# Patient Record
Sex: Female | Born: 1955 | Race: Black or African American | Hispanic: No | Marital: Married | Smoking: Former smoker
Health system: Southern US, Community
[De-identification: ages and names within clinical notes are randomized; demographics above are authoritative.]

## PROBLEM LIST (undated history)

## (undated) DIAGNOSIS — E039 Hypothyroidism, unspecified: Secondary | ICD-10-CM

## (undated) DIAGNOSIS — I82409 Acute embolism and thrombosis of unspecified deep veins of unspecified lower extremity: Secondary | ICD-10-CM

## (undated) DIAGNOSIS — E079 Disorder of thyroid, unspecified: Secondary | ICD-10-CM

## (undated) DIAGNOSIS — M1990 Unspecified osteoarthritis, unspecified site: Secondary | ICD-10-CM

## (undated) DIAGNOSIS — K219 Gastro-esophageal reflux disease without esophagitis: Secondary | ICD-10-CM

## (undated) DIAGNOSIS — I10 Essential (primary) hypertension: Secondary | ICD-10-CM

## (undated) DIAGNOSIS — E669 Obesity, unspecified: Secondary | ICD-10-CM

## (undated) HISTORY — DX: Obesity, unspecified: E66.9

## (undated) HISTORY — DX: Essential (primary) hypertension: I10

---

## 1999-04-28 ENCOUNTER — Encounter: Payer: Self-pay | Admitting: Emergency Medicine

## 1999-04-28 ENCOUNTER — Emergency Department (HOSPITAL_COMMUNITY): Admission: EM | Admit: 1999-04-28 | Discharge: 1999-04-28 | Payer: Self-pay | Admitting: Emergency Medicine

## 2000-03-10 ENCOUNTER — Inpatient Hospital Stay (HOSPITAL_COMMUNITY): Admission: RE | Admit: 2000-03-10 | Discharge: 2000-03-11 | Payer: Self-pay | Admitting: Family Medicine

## 2000-03-16 ENCOUNTER — Other Ambulatory Visit: Admission: RE | Admit: 2000-03-16 | Discharge: 2000-03-16 | Payer: Self-pay | Admitting: *Deleted

## 2000-03-30 ENCOUNTER — Other Ambulatory Visit: Admission: RE | Admit: 2000-03-30 | Discharge: 2000-03-30 | Payer: Self-pay | Admitting: *Deleted

## 2000-03-30 ENCOUNTER — Encounter (INDEPENDENT_AMBULATORY_CARE_PROVIDER_SITE_OTHER): Payer: Self-pay | Admitting: Specialist

## 2002-05-04 ENCOUNTER — Other Ambulatory Visit: Admission: RE | Admit: 2002-05-04 | Discharge: 2002-05-04 | Payer: Self-pay | Admitting: Obstetrics and Gynecology

## 2003-09-12 ENCOUNTER — Ambulatory Visit (HOSPITAL_COMMUNITY): Admission: RE | Admit: 2003-09-12 | Discharge: 2003-09-12 | Payer: Self-pay | Admitting: Family Medicine

## 2003-09-15 ENCOUNTER — Other Ambulatory Visit: Admission: RE | Admit: 2003-09-15 | Discharge: 2003-09-15 | Payer: Self-pay | Admitting: Obstetrics and Gynecology

## 2007-04-19 ENCOUNTER — Encounter: Admission: RE | Admit: 2007-04-19 | Discharge: 2007-04-19 | Payer: Self-pay | Admitting: Family Medicine

## 2011-01-24 NOTE — H&P (Signed)
[place]. [facility]  Patient:    Jocelyn White, Jocelyn White                  MRN: [id number] Adm. Date:  [id number] Disc. Date: [id number] Attending:  Pamelia Hoit CC:         Genene Churn. Cyndie Chime, M.D.             Andres Ege, M.D.                         History and Physical  IDENTIFICATION:  A 55 year old married black female from [place], [place].  CHIEF COMPLAINT:  Pain in right leg began 4 days ago.  HISTORY OF PRESENT ILLNESS:  The patient woke up Saturday morning with pain in her right leg.  There had been no recent change in activities or trauma.  No change in her medications.  She has a positive family history of blood clots in the legs of her mom, and aunt, and uncle.  She was seen in the office the day before admission at which time she was slightly tender in the right lower leg.  A venous Doppler exam done on the day of admission showed a superficial and deep vein thrombosis on the right leg extending to but not into the popliteal vein.  She was admitted for evaluation and treatment for early DVT, right leg.  PAST MEDICAL HISTORY:  She has no known drug allergies.  Has never had surgery.  Has been on a birth control for 27 years; this helps regulate otherwise heavy periods.  CURRENT MEDICATIONS:  Desogen prescribed by Dr. Andres Ege. She took Aleve over-the-counter. No other medicines.  HABITS:  She does not smoke cigarettes (quit smoking in 1986).  Does not drink alcohol.  SOCIAL AND FAMILY HISTORY:  She is the third of four siblings; all are well.  None have had blood clots.  She works at [employer]; is on her feet most of the day.  She has one daughter and one granddaughter--both are well. Mother has had blood clots in her legs and is on long-term Coumadin therapy. Aunt and uncle as above.  PHYSICAL EXAMINATION:  GENERAL:  A large black female in no distress.  Mental status is normal.  VITAL SIGNS:  She is  afebrile.  Pulse 68 and regular, respirations 20, blood pressure 147/80.  SKIN:  Without lesions.  HEENT:  Unremarkable.  HEART:  Regular rate and rhythm without murmurs, rubs, or sounds.  ABDOMEN:  Soft, somewhat overweight.  No masses or tenderness.  GU/BREAST/PELVIC:  Deferred.  EXTREMITIES:  Lower extremities have good pulses and no edema of feet and ankles.  She has tenderness in the mid medial right calf but no overlying redness or swelling or deformity.  The knee and ankle joints are normal without effusion.  Negative Homans sign.  Calf and ankle circumferences are equal, 19 inches and 9.5 inches, respectively.  ASSESSMENT:  First time deep venous thrombosis below the knee in the right leg, with a family history of DVT.  Oral contraceptive use for heavy menstrual bleeding.  PLAN:  Start Lovenox per pharmacy recommendation dosing.  Start Coumadin today.  Will discuss use of oral contraceptive medications with her gynecologist.  Will plan on outpatient consultation with hematology/oncology physician after pro times have been stabilized and blood clotting test results have come back.  Discussed case with Dr. Cephas Darby. DD:  03/11/00 TD:  03/11/00 Job: [id number]

## 2011-01-24 NOTE — Discharge Summary (Signed)
[place]. [facility]  Patient:    Jocelyn White, Jocelyn White                  MRN: [id number] Adm. Date:  [id number] Disc. Date: [id number] Attending:  Pamelia Hoit CC:         Cheryle Horsfall, M.D., OB/GYN             Genene Churn. Cyndie Chime, M.D.                           Discharge Summary  FINAL DIAGNOSES: 1. Deep venous thrombosis, right leg below the knee. 2. Oral contraceptive use. 3. Family history of deep venous thrombosis in mother and two maternal aunts. 4. Iron-deficiency anemia.  CONSULTATIONS:  None.  PROCEDURES:  Began anticoagulation therapy with Lovenox and Coumadin.  HOSPITAL COURSE:  The patient is a 55 year old woman from [place], [place], who began having pain in her right leg four days prior to admission. She woke up with this pain.  There had been no recent unusual activities or trauma.  She was seen in the office the day prior to admission and sent on the day of admission for venous Doppler exam which showed thromboses of deep vein and superficial vein to the right lower extremity but none proximal to the knee.  Due top her strong family history of deep venous thrombosis and use of oral contraceptives, she was admitted to start anticoagulation therapy.  This was done with the help of pharmacy to determine the dosing.  She was started on Coumadin and sent home the next day to self administer Lovenox for one week and to have outpatient pro times drawn and dosing adjusted.  Coagulation studies were obtained and pending at the time of discharge.  ADMISSION LABORATORY AND X-RAY DATA:  Doppler exam of right leg showing thrombosis of the lesser saphenous vein, no thrombosis in the popliteal vein, thrombosis in the distal posterior saphenous vein continuing to the upper calf.  CBC on admission: White count 7000, hemoglobin 9.9, MCV 68.5, platelets 113,000.  Pro time on admission and on discharge INR 1.2 both times. Functional protein  S 67 (68-128).  Functional protein C 134 (80-166).  Factor V Leiden mutation negative.  Lupus anticoagulation panel PPD 13.4 (12.0-15.5). Dilute Russell viper venom, PA 48 (33-46).  PTT-D 37 (30-44).  B2GLY - IgM 9, this is considered negative.  B2GLY - IgG 3, this is considered negative. Antiphospholipid lupus anticoagulant TNP (not able to perform LAC portions of the test).  Cardiolipin antibodies IgG 5 (0-16), IgM 11 (0-20), IgA 2 (0-10). Prothrombin gene mutation negative.  DISCHARGE PLAN:  The patient was discharged home after being taught how to self inject Lovenox and given prescription for Coumadin.  DISCHARGE MEDICATIONS: 1. Lovenox 100 mg subcutaneous injection q.12h. 2. Coumadin 5 mg p.o. q.d. 3. Nu-Iron 150 mg plus 500 mg vitamin C once a day. 4. Finish taking birth control pill Desogen through "day #21," then    discontinue this contraception, and on "day #22" start Micronor    1 tablet once a day p.o. continuously. 5. Avoid nonsteroidal anti-inflammatory medicines; use Tylenol as needed    for pain.  FOLLOWUP:  Obtain pro times at [facility] in two days and five days and make office visit in one week.  Follow up with Dr. Cheryle Horsfall in three weeks, and we will make an appointment to see Dr. Cyndie Chime in about a month to review  and interpret coagulation studies. DD:  04/27/00 TD:  04/28/00 Job: [id number]

## 2012-02-06 ENCOUNTER — Other Ambulatory Visit: Payer: Self-pay | Admitting: Family Medicine

## 2012-02-06 DIAGNOSIS — M549 Dorsalgia, unspecified: Secondary | ICD-10-CM

## 2012-02-06 DIAGNOSIS — D696 Thrombocytopenia, unspecified: Secondary | ICD-10-CM

## 2012-02-11 ENCOUNTER — Ambulatory Visit
Admission: RE | Admit: 2012-02-11 | Discharge: 2012-02-11 | Disposition: A | Discharge status: Home / Self Care | Payer: PRIVATE HEALTH INSURANCE | Source: Ambulatory Visit | Attending: Family Medicine | Admitting: Family Medicine

## 2012-02-11 DIAGNOSIS — D696 Thrombocytopenia, unspecified: Secondary | ICD-10-CM

## 2012-02-11 DIAGNOSIS — M549 Dorsalgia, unspecified: Secondary | ICD-10-CM

## 2012-02-11 MED ORDER — IOHEXOL 300 MG/ML  SOLN
125.0000 mL | Freq: Once | INTRAMUSCULAR | Status: AC | PRN
  Administered 2012-02-11: 125 mL via INTRAVENOUS

## 2013-02-22 ENCOUNTER — Encounter: Payer: Self-pay | Admitting: *Deleted

## 2013-02-22 ENCOUNTER — Encounter: Payer: PRIVATE HEALTH INSURANCE | Attending: Family Medicine | Admitting: *Deleted

## 2013-02-22 VITALS — Ht 64.0 in | Wt 254.1 lb

## 2013-02-22 DIAGNOSIS — Z713 Dietary counseling and surveillance: Secondary | ICD-10-CM | POA: Insufficient documentation

## 2013-02-22 DIAGNOSIS — I10 Essential (primary) hypertension: Secondary | ICD-10-CM

## 2013-02-22 DIAGNOSIS — E663 Overweight: Secondary | ICD-10-CM | POA: Insufficient documentation

## 2013-02-22 DIAGNOSIS — E669 Obesity, unspecified: Secondary | ICD-10-CM

## 2013-02-22 NOTE — Progress Notes (Signed)
  Medical Nutrition Therapy:  Appt start time: 1530 end time:  1630.   Assessment:  Primary concerns today: Jocelyn White is hear today since her doctor told her to lose weight. Heaviest weight is now, 150-160 was her lightest weight as an adult 29 years ago. After quitting smoking, getting married gained weight. Suggested talking to doctor about thyroid medication.     MEDICATIONS: see list   DIETARY INTAKE:  Usual eating pattern includes 3 meals during week and 2 meals per day on weekend and  0-2 snacks per day  Everyday foods include fried foods, hershey bar every night  24-hr recall:  B ( AM): boiled egg, 2 strips of bacon, fried bologna, steak biscuit during the week at work, sausage or bacon and fried egg on weekend Snk ( AM): none  L ( PM): salad with lettuce, tomato, cheese, boiled egg, bacon, and ranch dressing, french fries, pizza, hot dogs, fried fish Snk ( PM): none D ( PM): usually cooks at home - potato, fried pork chop, green beans, breakfast for dinner (sausage and eggs), chicken wings and pizza (take out), peas and carrots, beans, cabbage  Snk ( PM): chocolate bar (regular bar), bananas Beverages: orange juice 13.5 ounces, sweet tea regular size lots of ice, water, soda rarely  Usual physical activity: none  Estimated energy needs: 1600 calories  180 g carbohydrates 120 g protein 44 g fat  Progress Towards Goal(s):  In progress.   Nutritional Diagnosis:  -3.3 Overweight/obesity As related to excessive intake from fried foods and limited physical activity. .  As evidenced by dietary recall, doctor's referral, and BMI greater than 30..    Intervention:  Nutrition counseling provided:   Recommended that Jocelyn White make small changes that she could adapt into her current lifestyle. Recommendations included:    Have  just one fried food item per meal  follow My Plate for smaller portion sizes and food groups  smaller or darker pieces of chocolate  smaller glasses  of orange juice  getting physical activity on days off or taking the stairs on morning or afternoon breaks  Drink water throughout the day   Handouts given during visit include:  MyPlate placemat   Monitoring/Evaluation:  Dietary intake, exercise, and body weight in 4-6 week(s).

## 2013-02-22 NOTE — Patient Instructions (Addendum)
   Have  just one fried food item per meal  follow My Plate for smaller portion sizes and food groups  smaller or darker pieces of chocolate  smaller glasses of orange juice  getting physical activity on days off or taking the stairs on morning or afternoon breaks  Drink water throughout the day

## 2013-03-04 ENCOUNTER — Other Ambulatory Visit: Payer: Self-pay | Admitting: Obstetrics and Gynecology

## 2013-03-04 DIAGNOSIS — R29890 Loss of height: Secondary | ICD-10-CM

## 2013-03-25 ENCOUNTER — Ambulatory Visit
Admission: RE | Admit: 2013-03-25 | Discharge: 2013-03-25 | Disposition: A | Discharge status: Home / Self Care | Payer: PRIVATE HEALTH INSURANCE | Source: Ambulatory Visit | Attending: Obstetrics and Gynecology | Admitting: Obstetrics and Gynecology

## 2013-03-25 DIAGNOSIS — R29890 Loss of height: Secondary | ICD-10-CM

## 2013-04-08 ENCOUNTER — Ambulatory Visit: Payer: PRIVATE HEALTH INSURANCE | Admitting: *Deleted

## 2014-04-29 ENCOUNTER — Encounter (HOSPITAL_COMMUNITY): Payer: Self-pay | Admitting: Emergency Medicine

## 2014-04-29 ENCOUNTER — Emergency Department (HOSPITAL_COMMUNITY): Payer: BC Managed Care – PPO

## 2014-04-29 ENCOUNTER — Observation Stay (HOSPITAL_COMMUNITY)
Admission: EM | Admit: 2014-04-29 | Discharge: 2014-04-30 | Disposition: A | Discharge status: Home / Self Care | Payer: BC Managed Care – PPO | Attending: Internal Medicine | Admitting: Internal Medicine

## 2014-04-29 DIAGNOSIS — R0602 Shortness of breath: Secondary | ICD-10-CM | POA: Insufficient documentation

## 2014-04-29 DIAGNOSIS — R110 Nausea: Secondary | ICD-10-CM | POA: Insufficient documentation

## 2014-04-29 DIAGNOSIS — I10 Essential (primary) hypertension: Secondary | ICD-10-CM | POA: Diagnosis present

## 2014-04-29 DIAGNOSIS — Z87891 Personal history of nicotine dependence: Secondary | ICD-10-CM | POA: Diagnosis not present

## 2014-04-29 DIAGNOSIS — Z79899 Other long term (current) drug therapy: Secondary | ICD-10-CM | POA: Insufficient documentation

## 2014-04-29 DIAGNOSIS — E669 Obesity, unspecified: Secondary | ICD-10-CM | POA: Diagnosis not present

## 2014-04-29 DIAGNOSIS — Z791 Long term (current) use of non-steroidal anti-inflammatories (NSAID): Secondary | ICD-10-CM | POA: Insufficient documentation

## 2014-04-29 DIAGNOSIS — R079 Chest pain, unspecified: Principal | ICD-10-CM | POA: Diagnosis present

## 2014-04-29 DIAGNOSIS — E039 Hypothyroidism, unspecified: Secondary | ICD-10-CM | POA: Diagnosis present

## 2014-04-29 LAB — CBC
HEMATOCRIT: 40.4 % (ref 36.0–46.0)
Hemoglobin: 12.4 g/dL (ref 12.0–15.0)
MCH: 22.1 pg — ABNORMAL LOW (ref 26.0–34.0)
MCHC: 30.7 g/dL (ref 30.0–36.0)
MCV: 72.1 fL — AB (ref 78.0–100.0)
Platelets: 106 10*3/uL — ABNORMAL LOW (ref 150–400)
RBC: 5.6 MIL/uL — AB (ref 3.87–5.11)
RDW: 15.8 % — ABNORMAL HIGH (ref 11.5–15.5)
WBC: 7.8 10*3/uL (ref 4.0–10.5)

## 2014-04-29 LAB — I-STAT TROPONIN, ED
Troponin i, poc: 0 ng/mL (ref 0.00–0.08)
Troponin i, poc: 0 ng/mL (ref 0.00–0.08)

## 2014-04-29 LAB — BASIC METABOLIC PANEL
Anion gap: 10 (ref 5–15)
BUN: 14 mg/dL (ref 6–23)
CHLORIDE: 105 meq/L (ref 96–112)
CO2: 28 meq/L (ref 19–32)
CREATININE: 1.11 mg/dL — AB (ref 0.50–1.10)
Calcium: 9.3 mg/dL (ref 8.4–10.5)
GFR calc Af Amer: 62 mL/min — ABNORMAL LOW (ref >90)
GFR calc non Af Amer: 53 mL/min — ABNORMAL LOW (ref >90)
GLUCOSE: 95 mg/dL (ref 70–99)
Potassium: 4.7 mEq/L (ref 3.7–5.3)
Sodium: 143 mEq/L (ref 137–147)

## 2014-04-29 LAB — TROPONIN I: Troponin I: <0.3 ng/mL (ref <0.30)

## 2014-04-29 MED ORDER — ONDANSETRON HCL 4 MG/2ML IJ SOLN: 4.0000 mg | Freq: Four times a day (QID) | INTRAMUSCULAR | Status: DC | PRN

## 2014-04-29 MED ORDER — MORPHINE SULFATE 4 MG/ML IJ SOLN
4.0000 mg | Freq: Once | INTRAMUSCULAR | Status: DC
  Filled 2014-04-29: qty 1

## 2014-04-29 MED ORDER — LISINOPRIL 20 MG PO TABS
20.0000 mg | ORAL_TABLET | Freq: Every day | ORAL | Status: DC
  Administered 2014-04-30: 20 mg via ORAL
  Filled 2014-04-29 (×2): qty 1

## 2014-04-29 MED ORDER — NITROGLYCERIN 0.4 MG SL SUBL
0.4000 mg | SUBLINGUAL_TABLET | SUBLINGUAL | Status: AC | PRN
  Administered 2014-04-29 (×3): 0.4 mg via SUBLINGUAL
  Filled 2014-04-29: qty 1

## 2014-04-29 MED ORDER — ASPIRIN 325 MG PO TABS
325.0000 mg | ORAL_TABLET | Freq: Every day | ORAL | Status: DC
  Administered 2014-04-30: 325 mg via ORAL
  Filled 2014-04-29: qty 1

## 2014-04-29 MED ORDER — ONDANSETRON HCL 4 MG/2ML IJ SOLN
4.0000 mg | Freq: Once | INTRAMUSCULAR | Status: AC
  Administered 2014-04-29: 4 mg via INTRAVENOUS
  Filled 2014-04-29: qty 2

## 2014-04-29 MED ORDER — LEVOTHYROXINE SODIUM 150 MCG PO TABS
150.0000 ug | ORAL_TABLET | Freq: Every day | ORAL | Status: DC
  Administered 2014-04-30: 150 ug via ORAL
  Filled 2014-04-29 (×2): qty 1

## 2014-04-29 MED ORDER — ONDANSETRON HCL 4 MG PO TABS: 4.0000 mg | ORAL_TABLET | Freq: Four times a day (QID) | ORAL | Status: DC | PRN

## 2014-04-29 MED ORDER — PANTOPRAZOLE SODIUM 40 MG PO TBEC
40.0000 mg | DELAYED_RELEASE_TABLET | Freq: Every day | ORAL | Status: DC
  Administered 2014-04-30: 40 mg via ORAL
  Filled 2014-04-29: qty 1

## 2014-04-29 MED ORDER — SODIUM CHLORIDE 0.9 % IJ SOLN
3.0000 mL | Freq: Two times a day (BID) | INTRAMUSCULAR | Status: DC
  Administered 2014-04-29 – 2014-04-30 (×2): 3 mL via INTRAVENOUS

## 2014-04-29 MED ORDER — ACETAMINOPHEN 650 MG RE SUPP: 650.0000 mg | Freq: Four times a day (QID) | RECTAL | Status: DC | PRN

## 2014-04-29 MED ORDER — ACETAMINOPHEN 325 MG PO TABS: 650.0000 mg | ORAL_TABLET | Freq: Four times a day (QID) | ORAL | Status: DC | PRN

## 2014-04-29 MED ORDER — ALUM & MAG HYDROXIDE-SIMETH 200-200-20 MG/5ML PO SUSP: 30.0000 mL | Freq: Four times a day (QID) | ORAL | Status: DC | PRN

## 2014-04-29 MED ORDER — NITROGLYCERIN 2 % TD OINT
0.5000 [in_us] | TOPICAL_OINTMENT | Freq: Four times a day (QID) | TRANSDERMAL | Status: DC
  Administered 2014-04-29 – 2014-04-30 (×3): 0.5 [in_us] via TOPICAL
  Filled 2014-04-29: qty 30

## 2014-04-29 MED ORDER — ENOXAPARIN SODIUM 40 MG/0.4ML ~~LOC~~ SOLN
40.0000 mg | SUBCUTANEOUS | Status: DC
  Administered 2014-04-29: 40 mg via SUBCUTANEOUS
  Filled 2014-04-29 (×2): qty 0.4

## 2014-04-29 MED ORDER — SODIUM CHLORIDE 0.9 % IV SOLN: 250.0000 mL | INTRAVENOUS | Status: DC | PRN

## 2014-04-29 MED ORDER — OXYCODONE HCL 5 MG PO TABS: 5.0000 mg | ORAL_TABLET | ORAL | Status: DC | PRN

## 2014-04-29 MED ORDER — ASPIRIN 81 MG PO CHEW
324.0000 mg | CHEWABLE_TABLET | Freq: Once | ORAL | Status: AC
  Administered 2014-04-29: 324 mg via ORAL
  Filled 2014-04-29: qty 4

## 2014-04-29 MED ORDER — SODIUM CHLORIDE 0.9 % IJ SOLN: 3.0000 mL | INTRAMUSCULAR | Status: DC | PRN

## 2014-04-29 MED ORDER — HYDROMORPHONE HCL PF 1 MG/ML IJ SOLN: 0.5000 mg | INTRAMUSCULAR | Status: DC | PRN

## 2014-04-29 NOTE — H&P (Signed)
[facility] Admission History and Physical       Jocelyn White ZHY:[id number] DOB: 06/07/1956 DOA: 04/29/2014  Referring physician: EDP PCP: Delorse LekBURNETT,BRENT A, MD  Specialists:   Chief Complaint: Chest Pain  HPI: Jocelyn White is a 58 y.o. female with a history of HTN, Hypothyroid who presents tot he ED with complaints of Chest Pain that starrted at 1 pm while  And lasted until she had been given 3 SL NTG in the ED.   The pain was located in the mid-chest without radiation rates at 9/10 at the worse associated with SOB and nausea but no vomiting or diaphoresis.  She does also have a positive family histrory of cardiac Disease.   She was evaluated in the ED and had 2 negative troponins, and an EKG without acute changes.   She was referred for continue evaluation.     Review of Systems:  Constitutional: No Weight Loss, No Weight Gain, Night Sweats, Fevers, Chills, Dizziness, Fatigue, or Generalized Weakness HEENT: No Headaches, Difficulty Swallowing,Tooth/Dental Problems,Sore Throat,  No Sneezing, Rhinitis, Ear Ache, Nasal Congestion, or Post Nasal Drip,  Cardio-vascular:  +Chest pain, Orthopnea, PND, Edema in Lower Extremities, Anasarca, Dizziness, Palpitations  Resp: +Dyspnea, No DOE, No Cough, No Hemoptysis, No Wheezing.    GI: No Heartburn, Indigestion, Abdominal Pain, +Nausea, Vomiting, Diarrhea, Hematemesis, Hematochezia, Melena, Change in Bowel Habits,  Loss of Appetite  GU: No Dysuria, Change in Color of Urine, No Urgency or Frequency, No Flank pain.  Musculoskeletal: No Joint Pain or Swelling, No Decreased Range of Motion, No Back Pain.  Neurologic: No Syncope, No Seizures, Muscle Weakness, Paresthesia, Vision Disturbance or Loss, No Diplopia, No Vertigo, No Difficulty Walking,  Skin: No Rash or Lesions. Psych: No Change in Mood or Affect, No Depression or Anxiety, No Memory loss, No Confusion, or Hallucinations   Past Medical History  Diagnosis Date  .  Hypertension   . Obesity     History reviewed. No pertinent past surgical history.    Prior to Admission medications   Medication Sig Start Date End Date Taking? Authorizing Provider  levothyroxine (SYNTHROID, LEVOTHROID) 150 MCG tablet Take 150 mcg by mouth daily before breakfast.   Yes Historical Provider, MD  lisinopril (PRINIVIL,ZESTRIL) 20 MG tablet Take 20 mg by mouth daily.   Yes Historical Provider, MD  meloxicam (MOBIC) 15 MG tablet Take 15 mg by mouth daily.   Yes Historical Provider, MD  omeprazole (PRILOSEC) 40 MG capsule Take 40 mg by mouth daily.   Yes Historical Provider, MD    No Known Allergies   Social History:  reports that she has quit smoking. She does not have any smokeless tobacco history on file. Her alcohol and drug histories are not on file.     Family History  Problem Relation Age of Onset  . Cancer Other   . Heart attack Other   . Stroke Other   . Diabetes Other        Physical Exam:  GEN:  Pleasant Obese 58 y.o.  African American female examined  and in no acute distress; cooperative with exam Filed Vitals:   04/29/14 1842 04/29/14 1845 04/29/14 1930 04/29/14 2000  BP: 119/68 117/64 142/76 156/84  Pulse: 68 62 61 81  Temp:      TempSrc:      Resp: 22 14 14 25   Height:      Weight:      SpO2: 95% 98% 100% 100%   Blood pressure 156/84, pulse 81, temperature 97.8 F (  36.6 C), temperature source Oral, resp. rate 25, height 5\' 3"  (1.6 m), weight 115.667 kg (255 lb), SpO2 100.00%. PSYCH: She is alert and oriented x4; does not appear anxious does not appear depressed; affect is normal HEENT: Normocephalic and Atraumatic, Mucous membranes pink; PERRLA; EOM intact; Fundi:  Benign;  No scleral icterus, Nares: Patent, Oropharynx: Clear, Edentulous with Dentures, Neck:  FROM, No Cervical Lymphadenopathy nor Thyromegaly or Carotid Bruit; No JVD; Breasts:: Not examined CHEST WALL: No tenderness CHEST: Normal respiration, clear to auscultation  bilaterally HEART: Regular rate and rhythm; no murmurs rubs or gallops BACK: No kyphosis or scoliosis; No CVA tenderness ABDOMEN: Positive Bowel Sounds, Obese, Soft Non-Tender; No Masses, No Organomegaly. Rectal Exam: Not done EXTREMITIES: No Cyanosis, Clubbing, or Edema; No Ulcerations. Genitalia: not examined PULSES: 2+ and symmetric SKIN: Normal hydration no rash or ulceration CNS:  Alert and Oriented x 4, No Focal Deficits  Vascular: pulses palpable throughout    Labs on Admission:  Basic Metabolic Panel:  Recent Labs Lab 04/29/14 1531  NA 143  K 4.7  CL 105  CO2 28  GLUCOSE 95  BUN 14  CREATININE 1.11*  CALCIUM 9.3   Liver Function Tests: No results found for this basename: AST, ALT, ALKPHOS, BILITOT, PROT, ALBUMIN,  in the last 168 hours No results found for this basename: LIPASE, AMYLASE,  in the last 168 hours No results found for this basename: AMMONIA,  in the last 168 hours CBC:  Recent Labs Lab 04/29/14 1531  WBC 7.8  HGB 12.4  HCT 40.4  MCV 72.1*  PLT 106*   Cardiac Enzymes: No results found for this basename: CKTOTAL, CKMB, CKMBINDEX, TROPONINI,  in the last 168 hours  BNP (last 3 results) No results found for this basename: PROBNP,  in the last 8760 hours CBG: No results found for this basename: GLUCAP,  in the last 168 hours  Radiological Exams on Admission: Dg Chest 2 View  04/29/2014   CLINICAL DATA:  Chest pain  EXAM: CHEST  2 VIEW  COMPARISON:  None.  FINDINGS: Lungs are clear. Heart size and pulmonary vascularity are normal. No adenopathy. No pneumothorax. No bone lesions.  IMPRESSION: No edema or consolidation.   Electronically Signed   By: Bretta Bang M.D.   On: 04/29/2014 17:13     EKG: Independently reviewed. Normal Sinus Rhythm at 73 No Acute S-T Changes   Assessment/Plan:   58 y.o. female with  Principal Problem:   1.   Chest pain   Telemetry monitoring   Cycle Troponins, Troponin X 2 Negative already   Nitropaste, ASA,  O2  Active Problems:   2.   Benign essential hypertension   Continue Lisinopril Rx   Monitor BPs    3.   Hypothyroid   Continue Levothyroxine Rx   Check TSH Level     4.   DVT Prophylaxis   Lovenox      Code Status:    FULL CODE   Family Communication:  Family at bedside, Daughters   Disposition Plan:       Observation Telemetry for 24 -48 Hrs  Time spent:  60 Minutes  Ron Parker [facility] Pager [phone]   If 7AM -7PM Please Contact the Day Rounding Team MD for [facility]  If 7PM-7AM, Please Contact night-coverage  [email or web address] Password [facility] 04/29/2014, 8:33 PM

## 2014-04-29 NOTE — ED Notes (Signed)
Pt reports chest pain, onset 1.5 hours ago, while cleaning home, pt denies hx of same, sts that had sob and nausea with episode

## 2014-04-29 NOTE — ED Provider Notes (Signed)
CSN: [id number]     Arrival date & time 04/29/14  1449 History   First MD Initiated Contact with Patient 04/29/14 1638     Chief Complaint  Patient presents with  . Chest Pain     (Consider location/radiation/quality/duration/timing/severity/associated sxs/prior Treatment) HPI Pt is a 58yo female with hx of HTN presenting to ED with c/o centeralized chest pain that radiates to left and right side of lower chest that started about 1:30PM this afternoon while cleaning around her house. Pain is constant, feels like "bricks" sitting on pt's chest, 6/10.  Initial onset was associated with an episode of SOB and nausea but no vomiting or diaphoresis. Denies hx of similar pain. Denies hx of recent illness, cough or congestion. Denies palpitations. Denies SOB at this time. Pain does not radiate into back. Reports family hx of CAD.  Denies hx of diabetes.  Denies leg pain or swelling. Denies recent travel.    PCP: Dr. Doristine Counter, [facility]   Past Medical History  Diagnosis Date  . Hypertension   . Obesity    History reviewed. No pertinent past surgical history. Family History  Problem Relation Age of Onset  . Cancer Other   . Heart attack Other   . Stroke Other   . Diabetes Other    History  Substance Use Topics  . Smoking status: Former Games developer  . Smokeless tobacco: Not on file  . Alcohol Use: Not on file   OB History   Grav Para Term Preterm Abortions TAB SAB Ect Mult Living                 Review of Systems  Constitutional: Negative for fever, chills and diaphoresis.  Respiratory: Positive for shortness of breath. Negative for cough.   Cardiovascular: Positive for chest pain. Negative for palpitations and leg swelling.  Gastrointestinal: Positive for nausea. Negative for vomiting, abdominal pain, diarrhea and constipation.  All other systems reviewed and are negative.     Allergies  Review of patient's allergies indicates no known allergies.  Home  Medications   Prior to Admission medications   Medication Sig Start Date End Date Taking? Authorizing Provider  levothyroxine (SYNTHROID, LEVOTHROID) 150 MCG tablet Take 150 mcg by mouth daily before breakfast.   Yes Historical Provider, MD  lisinopril (PRINIVIL,ZESTRIL) 20 MG tablet Take 20 mg by mouth daily.   Yes Historical Provider, MD  meloxicam (MOBIC) 15 MG tablet Take 15 mg by mouth daily.   Yes Historical Provider, MD  omeprazole (PRILOSEC) 40 MG capsule Take 40 mg by mouth daily.   Yes Historical Provider, MD   BP 117/64  Pulse 62  Temp(Src) 97.8 F (36.6 C) (Oral)  Resp 14  Ht 5\' 3"  (1.6 m)  Wt 255 lb (115.667 kg)  BMI 45.18 kg/m2  SpO2 98% Physical Exam  Nursing note and vitals reviewed. Constitutional: She appears well-developed and well-nourished. No distress.  Pt lying comfortably in exam bed, NAD.   HENT:  Head: Normocephalic and atraumatic.  Eyes: Conjunctivae are normal. No scleral icterus.  Neck: Normal range of motion. Neck supple.  Cardiovascular: Normal rate, regular rhythm and normal heart sounds.   Pulmonary/Chest: Effort normal and breath sounds normal. No respiratory distress. She has no wheezes. She has no rales. She exhibits no tenderness.  Abdominal: Soft. Bowel sounds are normal. She exhibits no distension and no mass. There is no tenderness. There is no rebound and no guarding.  Musculoskeletal: Normal range of motion.  Neurological: She is alert.  Skin: Skin is warm and dry. She is not diaphoretic.    ED Course  Procedures (including critical care time) Labs Review Labs Reviewed  CBC - Abnormal; Notable for the following:    RBC 5.60 (*)    MCV 72.1 (*)    MCH 22.1 (*)    RDW 15.8 (*)    Platelets 106 (*)    All other components within normal limits  BASIC METABOLIC PANEL - Abnormal; Notable for the following:    Creatinine, Ser 1.11 (*)    GFR calc non Af Amer 53 (*)    GFR calc Af Amer 62 (*)    All other components within normal  limits  I-STAT TROPOININ, ED  Rosezena SensorI-STAT TROPOININ, ED    Imaging Review Dg Chest 2 View  04/29/2014   CLINICAL DATA:  Chest pain  EXAM: CHEST  2 VIEW  COMPARISON:  None.  FINDINGS: Lungs are clear. Heart size and pulmonary vascularity are normal. No adenopathy. No pneumothorax. No bone lesions.  IMPRESSION: No edema or consolidation.   Electronically Signed   By: Bretta BangWilliam  Woodruff M.D.   On: 04/29/2014 17:13     EKG Interpretation None      MDM   Final diagnoses:  Chest pain, unspecified chest pain type    Pt is a 58yo female c/o chest pain that started around 1:30PM when cleaning her house. Hx concerning for ACS, PE and pneumonia less likely, possibly musculoskeletal.  Pt reports episode of nausea and SOB at onset of CP but denies SOB upon arrival to ED. Denies fever, chills, n/v/d.  Vitals: unremarkable, mild bradycardia at times, O2-95-100% on RA.  Labs: unremarkable.  CXR: unremarkable.  Pt initially given aspirin which did not help CP, pt then given 3 SL nitro, which brought pt's pain from 6/10 to 0/10. EKG: NSR Discussed pt with Dr. Fredderick PhenixBelfi, pt is at moderate risk for major cardiac event based on HEART score. Will consult with hospitalist to admit pt for c/p r/o.    7:44 PM Consulted with Dr. Lovell SheehanJenkins, pt will be admitted for CP r/o to observation unit, tele bed. Pt is stable at this time.    Junius Finnerrin O'Malley, PA-C 04/29/14 1946

## 2014-04-29 NOTE — ED Provider Notes (Signed)
Medical screening examination/treatment/procedure(s) were performed by non-physician practitioner and as supervising physician I was immediately available for consultation/collaboration.   EKG Interpretation   Date/Time:  Saturday April 29 2014 15:04:18 EDT Ventricular Rate:  73 PR Interval:  138 QRS Duration: 84 QT Interval:  382 QTC Calculation: 420 R Axis:   55 Text Interpretation:  Normal sinus rhythm Normal ECG since last tracing no  significant change Confirmed by Linet Brash  MD, Zada Haser ([id number]) on 04/29/2014  8:13:19 PM        Rolan BuccoMelanie Marcello Tuzzolino, MD 04/29/14 2013

## 2014-04-30 ENCOUNTER — Observation Stay (HOSPITAL_COMMUNITY): Payer: BC Managed Care – PPO

## 2014-04-30 DIAGNOSIS — R079 Chest pain, unspecified: Secondary | ICD-10-CM

## 2014-04-30 LAB — BASIC METABOLIC PANEL
Anion gap: 11 (ref 5–15)
BUN: 13 mg/dL (ref 6–23)
CHLORIDE: 107 meq/L (ref 96–112)
CO2: 27 meq/L (ref 19–32)
Calcium: 9 mg/dL (ref 8.4–10.5)
Creatinine, Ser: 1.02 mg/dL (ref 0.50–1.10)
GFR calc Af Amer: 68 mL/min — ABNORMAL LOW (ref >90)
GFR calc non Af Amer: 59 mL/min — ABNORMAL LOW (ref >90)
GLUCOSE: 102 mg/dL — AB (ref 70–99)
POTASSIUM: 3.9 meq/L (ref 3.7–5.3)
Sodium: 145 mEq/L (ref 137–147)

## 2014-04-30 LAB — CBC
HEMATOCRIT: 38.6 % (ref 36.0–46.0)
HEMOGLOBIN: 11.7 g/dL — AB (ref 12.0–15.0)
MCH: 21.7 pg — ABNORMAL LOW (ref 26.0–34.0)
MCHC: 30.3 g/dL (ref 30.0–36.0)
MCV: 71.7 fL — ABNORMAL LOW (ref 78.0–100.0)
Platelets: 123 10*3/uL — ABNORMAL LOW (ref 150–400)
RBC: 5.38 MIL/uL — AB (ref 3.87–5.11)
RDW: 15.8 % — ABNORMAL HIGH (ref 11.5–15.5)
WBC: 7.9 10*3/uL (ref 4.0–10.5)

## 2014-04-30 LAB — TROPONIN I
Troponin I: <0.3 ng/mL (ref <0.30)
Troponin I: <0.3 ng/mL (ref <0.30)

## 2014-04-30 MED ORDER — TECHNETIUM TC 99M SESTAMIBI GENERIC - CARDIOLITE
10.0000 | Freq: Once | INTRAVENOUS | Status: AC | PRN
  Administered 2014-04-30: 10 via INTRAVENOUS

## 2014-04-30 MED ORDER — TECHNETIUM TC 99M SESTAMIBI - CARDIOLITE
30.0000 | Freq: Once | INTRAVENOUS | Status: AC | PRN
  Administered 2014-04-30: 12:00:00 30 via INTRAVENOUS

## 2014-04-30 MED ORDER — ACETAMINOPHEN 325 MG PO TABS: 650.0000 mg | ORAL_TABLET | Freq: Four times a day (QID) | ORAL | Status: DC | PRN

## 2014-04-30 MED ORDER — ASPIRIN EC 81 MG PO TBEC: 81.0000 mg | DELAYED_RELEASE_TABLET | Freq: Every day | ORAL | Status: DC

## 2014-04-30 NOTE — Discharge Instructions (Signed)
Please follow up with primary care doctor in 1-2 weeks as needed

## 2014-04-30 NOTE — Progress Notes (Signed)
Nsg Discharge Note  Admit Date:  04/29/2014 Discharge date: 04/30/2014   Loy Little to be D/C'd Home per MD order.  AVS completed.  Copy for chart, and copy for patient signed, and dated. Patient/caregiver able to verbalize understanding.  Discharge Medication:   Medication List    STOP taking these medications       meloxicam 15 MG tablet  Commonly known as:  MOBIC      TAKE these medications       acetaminophen 325 MG tablet  Commonly known as:  TYLENOL  Take 2 tablets (650 mg total) by mouth every 6 (six) hours as needed for mild pain (or Fever >/= 101).     levothyroxine 150 MCG tablet  Commonly known as:  SYNTHROID, LEVOTHROID  Take 150 mcg by mouth daily before breakfast.     lisinopril 20 MG tablet  Commonly known as:  PRINIVIL,ZESTRIL  Take 20 mg by mouth daily.     omeprazole 40 MG capsule  Commonly known as:  PRILOSEC  Take 40 mg by mouth daily.        Discharge Assessment: Filed Vitals:   04/30/14 1341  BP: 110/63  Pulse: 62  Temp: 98.3 F (36.8 C)  Resp: 17   Skin clean, dry and intact without evidence of skin break down, no evidence of skin tears noted. IV catheter discontinued intact. Site without signs and symptoms of complications - no redness or edema noted at insertion site, patient denies c/o pain - only slight tenderness at site.  Dressing with slight pressure applied.  D/c Instructions-Education: Discharge instructions given to patient/family with verbalized understanding. D/c education completed with patient/family including follow up instructions, medication list, d/c activities limitations if indicated, with other d/c instructions as indicated by MD - patient able to verbalize understanding, all questions fully answered. Patient instructed to return to ED, call 911, or call MD for any changes in condition.  Patient escorted via WC, and D/C home via private auto.  Nilam Quakenbush Consuella Lose, RN 04/30/2014 4:32 PM

## 2014-04-30 NOTE — Progress Notes (Signed)
     The patient was seen in nuclear medicine for a exercise myoview.  She tolerated the procedure well. She walked 6 minutes on Bruce protocol and reached target HR. Very slight TWI in lead III and AVF. No CP or SOB. Will await images.     Thereasa Parkin PA-C  [facility]

## 2014-04-30 NOTE — Progress Notes (Signed)
Utilization Review Completed.   Nilan Iddings, RN, BSN Nurse Case Manager  

## 2014-04-30 NOTE — Discharge Summary (Signed)
Physician Discharge Summary  Jocelyn White ZOX:[id number] DOB: 10-20-1955 DOA: 04/29/2014  PCP: Delorse Lek, MD  Admit date: 04/29/2014 Discharge date: 04/30/2014  Time spent: >35 minutes  Recommendations for Outpatient Follow-up:  follow up with primary care doctor in 1-2 weeks as needed   Discharge Diagnoses:  Principal Problem:   Chest pain Active Problems:   Benign essential hypertension   Hypothyroid   Discharge Condition: stable   Diet recommendation: low sodium  Filed Weights   04/29/14 1508 04/29/14 2035 04/30/14 0556  Weight: 115.667 kg (255 lb) 108.636 kg (239 lb 8 oz) 108.6 kg (239 lb 6.7 oz)    History of present illness:  58 y.o. female with a history of HTN, Hypothyroid who presents tot he ED with complaints of Chest Pain   Hospital Course:  1. Chest pain, atypical; ACS ruled out; ECG, trop unremarkable  -Pt is chest pain free  -stress test: negative; appreciate cardiology assistance  2. HTN, stable cont home regimen  3. Hypothyroidism, cont home regimen  4. GERD, cont PPI   IMPRESSION:  1. No reversible ischemia or infarction.  2. Normal left ventricular wall motion.  3. Left ventricular ejection fraction 63%  4. Low-risk stress test findings*.    Procedures:  Stress test  (i.e. Studies not automatically included, echos, thoracentesis, etc; not x-rays)  Consultations:  Cardiology   Discharge Exam: Filed Vitals:   04/30/14 1320  BP: 118/82  Pulse:   Temp:   Resp:     General: alert Cardiovascular: s1,s2 rrr Respiratory: CTA BL  Discharge Instructions  Discharge Instructions   Diet - low sodium heart healthy    Complete by:  As directed      Discharge instructions    Complete by:  As directed   Please follow up with primary care doctor in 1-2 weeks as needed     Increase activity slowly    Complete by:  As directed             Medication List    STOP taking these medications       meloxicam 15 MG tablet   Commonly known as:  MOBIC      TAKE these medications       acetaminophen 325 MG tablet  Commonly known as:  TYLENOL  Take 2 tablets (650 mg total) by mouth every 6 (six) hours as needed for mild pain (or Fever >/= 101).     levothyroxine 150 MCG tablet  Commonly known as:  SYNTHROID, LEVOTHROID  Take 150 mcg by mouth daily before breakfast.     lisinopril 20 MG tablet  Commonly known as:  PRINIVIL,ZESTRIL  Take 20 mg by mouth daily.     omeprazole 40 MG capsule  Commonly known as:  PRILOSEC  Take 40 mg by mouth daily.       No Known Allergies     Follow-up Information   Follow up with BURNETT,BRENT A, MD In 1 week.   Specialty:  Family Medicine   Contact information:   [address] [place] [address] [phone]        The results of significant diagnostics from this hospitalization (including imaging, microbiology, ancillary and laboratory) are listed below for reference.    Significant Diagnostic Studies: Dg Chest 2 View  04/29/2014   CLINICAL DATA:  Chest pain  EXAM: CHEST  2 VIEW  COMPARISON:  None.  FINDINGS: Lungs are clear. Heart size and pulmonary vascularity are normal. No adenopathy. No pneumothorax.  No bone lesions.  IMPRESSION: No edema or consolidation.   Electronically Signed   By: Bretta Bang M.D.   On: 04/29/2014 17:13   Nm Myocar Multi W/spect W/wall Motion / Ef  04/30/2014   CLINICAL DATA:  Chest pain  EXAM: MYOCARDIAL IMAGING WITH SPECT (REST AND PHARMACOLOGIC-STRESS)  GATED LEFT VENTRICULAR WALL MOTION STUDY  LEFT VENTRICULAR EJECTION FRACTION  TECHNIQUE: Standard myocardial SPECT imaging was performed after resting intravenous injection of 10 mCi Tc-37m sestamibi. Subsequently, intravenous infusion of Lexiscan was performed under the supervision of the Cardiology staff. At peak effect of the drug, 30 mCi Tc-56m sestamibi was injected intravenously and standard myocardial SPECT imaging was performed. Quantitative gated  imaging was also performed to evaluate left ventricular wall motion, and estimate left ventricular ejection fraction.  COMPARISON:  None.  FINDINGS: Perfusion: No decreased activity in the left ventricle on stress imaging to suggest reversible ischemia or infarction. Apical attenuation artifact noted, suspect related to breast attenuation.  Wall Motion: Normal left ventricular wall motion. No left ventricular dilation.  Left Ventricular Ejection Fraction: 63 %  End diastolic volume 95 ml  End systolic volume 35 ml  IMPRESSION: 1. No reversible ischemia or infarction.  2. Normal left ventricular wall motion.  3. Left ventricular ejection fraction 63%  4. Low-risk stress test findings*.  *2012 Appropriate Use Criteria for Coronary Revascularization Focused Update: J Am Coll Cardiol. 2012;59(9):857-881. http://content.dementiazones.com.aspx?articleid=1201161   Electronically Signed   By: Ruel Favors M.D.   On: 04/30/2014 12:33    Microbiology: No results found for this or any previous visit (from the past 240 hour(s)).   Labs: Basic Metabolic Panel:  Recent Labs Lab 04/29/14 1531 04/30/14 0345  NA 143 145  K 4.7 3.9  CL 105 107  CO2 28 27  GLUCOSE 95 102*  BUN 14 13  CREATININE 1.11* 1.02  CALCIUM 9.3 9.0   Liver Function Tests: No results found for this basename: AST, ALT, ALKPHOS, BILITOT, PROT, ALBUMIN,  in the last 168 hours No results found for this basename: LIPASE, AMYLASE,  in the last 168 hours No results found for this basename: AMMONIA,  in the last 168 hours CBC:  Recent Labs Lab 04/29/14 1531 04/30/14 0345  WBC 7.8 7.9  HGB 12.4 11.7*  HCT 40.4 38.6  MCV 72.1* 71.7*  PLT 106* 123*   Cardiac Enzymes:  Recent Labs Lab 04/29/14 2142 04/30/14 0346 04/30/14 0800  TROPONINI <0.30 <0.30 <0.30   BNP: BNP (last 3 results) No results found for this basename: PROBNP,  in the last 8760 hours CBG: No results found for this basename: GLUCAP,  in the last 168  hours     Signed:  Esperanza Sheets  [facility] 04/30/2014, 1:33 PM

## 2014-04-30 NOTE — Consult Note (Signed)
CARDIOLOGY CONSULT NOTE       Patient ID: Jocelyn White MRN: [id number] DOB/AGE: [birth year] 58 y.o.  Admit date: 04/29/2014 Referring Physician:  Lovell Sheehan Primary Physician: Delorse Lek, MD Primary Cardiologist:  New Reason for Consultation:  Chest Pain  Principal Problem:   Chest pain Active Problems:   Benign essential hypertension   Hypothyroid   HPI:   58 yo with no previous history of CAD  Previous benign palpitations.  CRF; HTN on ACE.  Was cleaning house before lunch yesterday and had sharp SSCP Some dyspnea.  Pain then radiated to left shoulder  "I was really scared"  Lasted about 2 hours and improved in ER.  Has not had pain like this before ? Mild nausea no vomiting.  No recent trauma  No history of GERD or PUD  Feels better this am.  Sedentary with mild chronic exertional dyspnea  Compliant with meds.  Pain was not pleuritic or positional   ROS All other systems reviewed and negative except as noted above  Past Medical History  Diagnosis Date  . Hypertension   . Obesity     Family History  Problem Relation Age of Onset  . Cancer Other   . Heart attack Other   . Stroke Other   . Diabetes Other     History   Social History  . Marital Status: Married    Spouse Name: N/A    Number of Children: N/A  . Years of Education: N/A   Occupational History  . Not on file.   Social History Main Topics  . Smoking status: Former Games developer  . Smokeless tobacco: Not on file  . Alcohol Use: Not on file  . Drug Use: Not on file  . Sexual Activity: Not on file   Other Topics Concern  . Not on file   Social History Narrative  . No narrative on file    History reviewed. No pertinent past surgical history.   Marland Kitchen aspirin  325 mg Oral Daily  . enoxaparin (LOVENOX) injection  40 mg Subcutaneous Q24H  . levothyroxine  150 mcg Oral QAC breakfast  . lisinopril  20 mg Oral Daily  . morphine  4 mg Intravenous Once  . nitroGLYCERIN  0.5 inch Topical 4 times per day    . pantoprazole  40 mg Oral Daily  . sodium chloride  3 mL Intravenous Q12H      Physical Exam: Blood pressure 108/59, pulse 70, temperature 97.9 F (36.6 C), temperature source Oral, resp. rate 18, height 5' 3.5" (1.613 m), weight 239 lb 6.7 oz (108.6 kg), SpO2 98.00%.    Affect appropriate Obese black female  HEENT: normal Neck supple with no adenopathy JVP normal no bruits no thyromegaly Lungs clear with no wheezing and good diaphragmatic motion Heart:  S1/S2 no murmur, no rub, gallop or click PMI normal Abdomen: benighn, BS positve, no tenderness, no AAA no bruit.  No HSM or HJR Distal pulses intact with no bruits No edema Neuro non-focal Skin warm and dry No muscular weakness   Labs:   Lab Results  Component Value Date   WBC 7.9 04/30/2014   HGB 11.7* 04/30/2014   HCT 38.6 04/30/2014   MCV 71.7* 04/30/2014   PLT 123* 04/30/2014    Recent Labs Lab 04/30/14 0345  NA 145  K 3.9  CL 107  CO2 27  BUN 13  CREATININE 1.02  CALCIUM 9.0  GLUCOSE 102*   Lab Results  Component Value Date   TROPONINI <0.30  04/30/2014       Radiology: Dg Chest 2 View  04/29/2014   CLINICAL DATA:  Chest pain  EXAM: CHEST  2 VIEW  COMPARISON:  None.  FINDINGS: Lungs are clear. Heart size and pulmonary vascularity are normal. No adenopathy. No pneumothorax. No bone lesions.  IMPRESSION: No edema or consolidation.   Electronically Signed   By: Bretta Bang M.D.   On: 04/29/2014 17:13    EKG:  SR no acute ST changes low voltage due to body habitus   ASSESSMENT AND PLAN:  Chest Pain:  Somewhat atypical R/O  Have ordered exercise myovue.  Family will try to get tennis shoes for her.  She thinks she can negotiate treadmill HTN:  Continue ACE Thrombocytopenia:  Outpatient f/u Dr Doristine Counter  Denies ETOH  Signed: Charlton Haws 04/30/2014, 8:38 AM

## 2014-04-30 NOTE — Progress Notes (Addendum)
[facility] PROGRESS NOTE  Jocelyn White ZOX:[id number] DOB: March 08, 1956 DOA: 04/29/2014 PCP: Delorse Lek, MD  Assessment/Plan: 58 y.o. female with a history of HTN, Hypothyroid who presents tot he ED with complaints of Chest Pain  1. Chest pain, atypical; ACS ruled out; ECG, trop unremarkable -Pt is chest pain free  -Pt is being scheduled for stress test; appreciate cardiology assistance   2. HTN, stable cont home regimen  3. Hypothyroidism, cont home regimen  4. GERD, cont PPI    Code Status: full Family Communication:  D/w patient, her daughter  (indicate person spoken with, relationship, and if by phone, the number) Disposition Plan: home 24-48 hrs    Consultants:  Cardiology   Procedures:  Pend stress test   Antibiotics:  none (indicate start date, and stop date if known)  HPI/Subjective: alert  Objective: Filed Vitals:   04/30/14 0556  BP: 108/59  Pulse: 70  Temp: 97.9 F (36.6 C)  Resp: 18    Intake/Output Summary (Last 24 hours) at 04/30/14 0854 Last data filed at 04/29/14 2200  Gross per 24 hour  Intake      3 ml  Output    500 ml  Net   -497 ml   Filed Weights   04/29/14 1508 04/29/14 2035 04/30/14 0556  Weight: 115.667 kg (255 lb) 108.636 kg (239 lb 8 oz) 108.6 kg (239 lb 6.7 oz)    Exam:   General:  alert  Cardiovascular: s1,s2 rrr  Respiratory: CTA BL  Abdomen: soft, nt,nd   Musculoskeletal: no LE edema    Data Reviewed: Basic Metabolic Panel:  Recent Labs Lab 04/29/14 1531 04/30/14 0345  NA 143 145  K 4.7 3.9  CL 105 107  CO2 28 27  GLUCOSE 95 102*  BUN 14 13  CREATININE 1.11* 1.02  CALCIUM 9.3 9.0   Liver Function Tests: No results found for this basename: AST, ALT, ALKPHOS, BILITOT, PROT, ALBUMIN,  in the last 168 hours No results found for this basename: LIPASE, AMYLASE,  in the last 168 hours No results found for this basename: AMMONIA,  in the last 168 hours CBC:  Recent Labs Lab  04/29/14 1531 04/30/14 0345  WBC 7.8 7.9  HGB 12.4 11.7*  HCT 40.4 38.6  MCV 72.1* 71.7*  PLT 106* 123*   Cardiac Enzymes:  Recent Labs Lab 04/29/14 2142 04/30/14 0346 04/30/14 0800  TROPONINI <0.30 <0.30 <0.30   BNP (last 3 results) No results found for this basename: PROBNP,  in the last 8760 hours CBG: No results found for this basename: GLUCAP,  in the last 168 hours  No results found for this or any previous visit (from the past 240 hour(s)).   Studies: Dg Chest 2 View  04/29/2014   CLINICAL DATA:  Chest pain  EXAM: CHEST  2 VIEW  COMPARISON:  None.  FINDINGS: Lungs are clear. Heart size and pulmonary vascularity are normal. No adenopathy. No pneumothorax. No bone lesions.  IMPRESSION: No edema or consolidation.   Electronically Signed   By: Bretta Bang M.D.   On: 04/29/2014 17:13    Scheduled Meds: . aspirin  325 mg Oral Daily  . enoxaparin (LOVENOX) injection  40 mg Subcutaneous Q24H  . levothyroxine  150 mcg Oral QAC breakfast  . lisinopril  20 mg Oral Daily  . morphine  4 mg Intravenous Once  . nitroGLYCERIN  0.5 inch Topical 4 times per day  . pantoprazole  40 mg Oral Daily  . sodium chloride  3  mL Intravenous Q12H   Continuous Infusions:   Principal Problem:   Chest pain Active Problems:   Benign essential hypertension   Hypothyroid    Time spent: >35 minutes     Esperanza Sheets  [facility] Pager [phone]. If 7PM-7AM, please contact night-coverage at [email or web address], password [facility] 04/30/2014, 8:54 AM  LOS: 1 day

## 2015-02-05 ENCOUNTER — Emergency Department (HOSPITAL_BASED_OUTPATIENT_CLINIC_OR_DEPARTMENT_OTHER)
Admission: EM | Admit: 2015-02-05 | Discharge: 2015-02-05 | Disposition: A | Discharge status: Home / Self Care | Payer: BLUE CROSS/BLUE SHIELD | Attending: Emergency Medicine | Admitting: Emergency Medicine

## 2015-02-05 ENCOUNTER — Encounter (HOSPITAL_BASED_OUTPATIENT_CLINIC_OR_DEPARTMENT_OTHER): Payer: Self-pay

## 2015-02-05 ENCOUNTER — Emergency Department (HOSPITAL_BASED_OUTPATIENT_CLINIC_OR_DEPARTMENT_OTHER): Payer: BLUE CROSS/BLUE SHIELD

## 2015-02-05 DIAGNOSIS — I10 Essential (primary) hypertension: Secondary | ICD-10-CM | POA: Insufficient documentation

## 2015-02-05 DIAGNOSIS — Z87891 Personal history of nicotine dependence: Secondary | ICD-10-CM | POA: Insufficient documentation

## 2015-02-05 DIAGNOSIS — E669 Obesity, unspecified: Secondary | ICD-10-CM | POA: Insufficient documentation

## 2015-02-05 DIAGNOSIS — M5417 Radiculopathy, lumbosacral region: Secondary | ICD-10-CM

## 2015-02-05 DIAGNOSIS — Z79899 Other long term (current) drug therapy: Secondary | ICD-10-CM | POA: Insufficient documentation

## 2015-02-05 DIAGNOSIS — Z86718 Personal history of other venous thrombosis and embolism: Secondary | ICD-10-CM | POA: Insufficient documentation

## 2015-02-05 DIAGNOSIS — M25551 Pain in right hip: Secondary | ICD-10-CM | POA: Diagnosis present

## 2015-02-05 DIAGNOSIS — M25559 Pain in unspecified hip: Secondary | ICD-10-CM

## 2015-02-05 HISTORY — DX: Acute embolism and thrombosis of unspecified deep veins of unspecified lower extremity: I82.409

## 2015-02-05 MED ORDER — HYDROCODONE-ACETAMINOPHEN 5-325 MG PO TABS: 1.0000 | ORAL_TABLET | Freq: Four times a day (QID) | ORAL | Status: DC | PRN

## 2015-02-05 MED ORDER — PREDNISONE 50 MG PO TABS: 50.0000 mg | ORAL_TABLET | Freq: Every day | ORAL | Status: DC

## 2015-02-05 MED ORDER — OXYCODONE-ACETAMINOPHEN 5-325 MG PO TABS
1.0000 | ORAL_TABLET | Freq: Once | ORAL | Status: AC
  Administered 2015-02-05: 1 via ORAL
  Filled 2015-02-05: qty 1

## 2015-02-05 NOTE — ED Notes (Signed)
Patient transported to X-ray 

## 2015-02-05 NOTE — ED Notes (Signed)
C/o pain to right hip and upper leg since Friday-denies injury-pt was seated in w/c in ED WR-states she did walk in to ED WR

## 2015-02-05 NOTE — ED Provider Notes (Signed)
CSN: [id number]     Arrival date & time 02/05/15  1157 History   First MD Initiated Contact with Patient 02/05/15 1220     Chief Complaint  Patient presents with  . Hip Pain     (Consider location/radiation/quality/duration/timing/severity/associated sxs/prior Treatment) HPI Jocelyn White is a 59 y.o. female With history of DVT, hypertension, presents to emergency department complaining of right hip and right thigh pain. Patient states pain started 3 days ago. States pain started in the right lower back and in the right buttock. Over last several days pain started radiating down posterior lateral right thigh. States his stops at the knee. She denies any numbness or weakness in extremities. She denies any problems with bowels or with urination. She denies any fever or chills. No injuries. She states she has had similar pain in the left side but was milder than the one she has now. She states she is unable to sleep all night because of pain. She states she had a muscle relaxant left over from when she was treated for left hip pain so she take that with no relief. She also states she took over-the-counter arthritis medication which did not help. She denies swelling in the leg, no pain in the calf or lower leg. Pain is worsened with movement. Nothing makes it better.   Past Medical History  Diagnosis Date  . Hypertension   . Obesity   . DVT (deep venous thrombosis)    History reviewed. No pertinent past surgical history. Family History  Problem Relation Age of Onset  . Cancer Other   . Heart attack Other   . Stroke Other   . Diabetes Other    History  Substance Use Topics  . Smoking status: Former Games developer  . Smokeless tobacco: Not on file  . Alcohol Use: No   OB History    No data available     Review of Systems  Constitutional: Negative for fever and chills.  Respiratory: Negative.   Cardiovascular: Negative.   Gastrointestinal: Negative for abdominal pain.  Genitourinary:  Negative for difficulty urinating.  Musculoskeletal: Positive for back pain and arthralgias. Negative for neck pain.  Neurological: Negative for weakness and numbness.  All other systems reviewed and are negative.     Allergies  Review of patient's allergies indicates no known allergies.  Home Medications   Prior to Admission medications   Medication Sig Start Date End Date Taking? Authorizing Provider  acetaminophen (TYLENOL) 325 MG tablet Take 2 tablets (650 mg total) by mouth every 6 (six) hours as needed for mild pain (or Fever >/= 101). 04/30/14   Esperanza Sheets, MD  levothyroxine (SYNTHROID, LEVOTHROID) 150 MCG tablet Take 150 mcg by mouth daily before breakfast.    Historical Provider, MD  lisinopril (PRINIVIL,ZESTRIL) 20 MG tablet Take 20 mg by mouth daily.    Historical Provider, MD  omeprazole (PRILOSEC) 40 MG capsule Take 40 mg by mouth daily.    Historical Provider, MD   BP 132/77 mmHg  Pulse 80  Temp(Src) 98 F (36.7 C) (Oral)  Resp 18  Ht  (1.626 m)  Wt 250 lb (113.399 kg)  BMI 42.89 kg/m2  SpO2 99% Physical Exam  Constitutional: She is oriented to person, place, and time. She appears well-developed and well-nourished. No distress.  HENT:  Head: Normocephalic.  Eyes: Conjunctivae are normal.  Neck: Neck supple.  Cardiovascular: Normal rate, regular rhythm and normal heart sounds.   Pulmonary/Chest: Effort normal and breath sounds normal. No respiratory  distress. She has no wheezes. She has no rales.  Musculoskeletal: She exhibits no edema.  Tender to palpation in the right buttock. No tenderness over midline lumbar spine, no tenderness in the right SI joint, none tenderness along the hip joint or the thigh. No pain with range of motion at the hip, no pain with flexion, internal or external rotation. Normal right knee. Dorsal pedal pulses intact. Negative right straight leg raise test.  Neurological: She is alert and oriented to person, place, and time.  5/5  and equal lower extremity strength. 2+ and equal patellar reflexes bilaterally. Pt able to dorsiflex bilateral toes and feet with good strength against resistance. Equal sensation bilaterally over thighs and lower legs.   Skin: Skin is warm and dry.  Psychiatric: She has a normal mood and affect. Her behavior is normal.  Nursing note and vitals reviewed.   ED Course  Procedures (including critical care time) Labs Review Labs Reviewed - No data to display  Imaging Review No results found.   EKG Interpretation None      MDM   Final diagnoses:  Hip pain  Lumbosacral radiculopathy   Pt with right hip and right thigh pain. Pt states she may have a hearniated disk in the past. States similar symptoms on the left, just not as severe. Full ROM of the hip. Able to ambulate. Will get xrays of lumbar spine and right hip. Percocet ordered for pain.    X-rays unremarkable except forgrade 1 anterolisthesis of L4-L5 which is chronic. Some degenerative changes seen. Patient's symptoms most likely are due to lumbar sacral radiculopathy. Plan to discharge home with Norco for pain, prednisone, follow up with primary care doctor. She is able to with no difficulties. She is neurovascularly intact. No evidence of cauda equina  Filed Vitals:   02/05/15 1205  BP: 132/77  Pulse: 80  Temp: 98 F (36.7 C)  TempSrc: Oral  Resp: 18  Height: 5\' 4"  (1.626 m)  Weight: 250 lb (113.399 kg)  SpO2: 99%     Jaynie Crumbleatyana Midori Dado, PA-C 02/05/15 1641  Arby BarretteMarcy Pfeiffer, MD 02/05/15 1715

## 2015-02-05 NOTE — Discharge Instructions (Signed)
norco for pain as prescribed as needed. If going to work, take extra strength tylenol. Take prednisone as prescribed until all gone. Follow up with your doctor. Return if worsening symptoms.   Sciatica Sciatica is pain, weakness, numbness, or tingling along the path of the sciatic nerve. The nerve starts in the lower back and runs down the back of each leg. The nerve controls the muscles in the lower leg and in the back of the knee, while also providing sensation to the back of the thigh, lower leg, and the sole of your foot. Sciatica is a symptom of another medical condition. For instance, nerve damage or certain conditions, such as a herniated disk or bone spur on the spine, pinch or put pressure on the sciatic nerve. This causes the pain, weakness, or other sensations normally associated with sciatica. Generally, sciatica only affects one side of the body. CAUSES   Herniated or slipped disc.  Degenerative disk disease.  A pain disorder involving the narrow muscle in the buttocks (piriformis syndrome).  Pelvic injury or fracture.  Pregnancy.  Tumor (rare). SYMPTOMS  Symptoms can vary from mild to very severe. The symptoms usually travel from the low back to the buttocks and down the back of the leg. Symptoms can include:  Mild tingling or dull aches in the lower back, leg, or hip.  Numbness in the back of the calf or sole of the foot.  Burning sensations in the lower back, leg, or hip.  Sharp pains in the lower back, leg, or hip.  Leg weakness.  Severe back pain inhibiting movement. These symptoms may get worse with coughing, sneezing, laughing, or prolonged sitting or standing. Also, being overweight may worsen symptoms. DIAGNOSIS  Your caregiver will perform a physical exam to look for common symptoms of sciatica. He or she may ask you to do certain movements or activities that would trigger sciatic nerve pain. Other tests may be performed to find the cause of the sciatica. These  may include:  Blood tests.  X-rays.  Imaging tests, such as an MRI or CT scan. TREATMENT  Treatment is directed at the cause of the sciatic pain. Sometimes, treatment is not necessary and the pain and discomfort goes away on its own. If treatment is needed, your caregiver may suggest:  Over-the-counter medicines to relieve pain.  Prescription medicines, such as anti-inflammatory medicine, muscle relaxants, or narcotics.  Applying heat or ice to the painful area.  Steroid injections to lessen pain, irritation, and inflammation around the nerve.  Reducing activity during periods of pain.  Exercising and stretching to strengthen your abdomen and improve flexibility of your spine. Your caregiver may suggest losing weight if the extra weight makes the back pain worse.  Physical therapy.  Surgery to eliminate what is pressing or pinching the nerve, such as a bone spur or part of a herniated disk. HOME CARE INSTRUCTIONS   Only take over-the-counter or prescription medicines for pain or discomfort as directed by your caregiver.  Apply ice to the affected area for 20 minutes, 3-4 times a day for the first 48-72 hours. Then try heat in the same way.  Exercise, stretch, or perform your usual activities if these do not aggravate your pain.  Attend physical therapy sessions as directed by your caregiver.  Keep all follow-up appointments as directed by your caregiver.  Do not wear high heels or shoes that do not provide proper support.  Check your mattress to see if it is too soft. A firm mattress  may lessen your pain and discomfort. SEEK IMMEDIATE MEDICAL CARE IF:   You lose control of your bowel or bladder (incontinence).  You have increasing weakness in the lower back, pelvis, buttocks, or legs.  You have redness or swelling of your back.  You have a burning sensation when you urinate.  You have pain that gets worse when you lie down or awakens you at night.  Your pain is  worse than you have experienced in the past.  Your pain is lasting longer than 4 weeks.  You are suddenly losing weight without reason. MAKE SURE YOU:  Understand these instructions.  Will watch your condition.  Will get help right away if you are not doing well or get worse. Document Released: 08/19/2001 Document Revised: 02/24/2012 Document Reviewed: 01/04/2012 [facility] Patient Information 2015 [employer] [place], [place]. This information is not intended to replace advice given to you by your health care provider. Make sure you discuss any questions you have with your health care provider.

## 2015-02-12 ENCOUNTER — Encounter (HOSPITAL_COMMUNITY): Payer: Self-pay | Admitting: Family Medicine

## 2015-02-12 ENCOUNTER — Emergency Department (HOSPITAL_COMMUNITY)
Admission: EM | Admit: 2015-02-12 | Discharge: 2015-02-12 | Disposition: A | Discharge status: Home / Self Care | Payer: BLUE CROSS/BLUE SHIELD | Attending: Emergency Medicine | Admitting: Emergency Medicine

## 2015-02-12 DIAGNOSIS — M549 Dorsalgia, unspecified: Secondary | ICD-10-CM | POA: Diagnosis present

## 2015-02-12 DIAGNOSIS — Z7952 Long term (current) use of systemic steroids: Secondary | ICD-10-CM | POA: Diagnosis not present

## 2015-02-12 DIAGNOSIS — I10 Essential (primary) hypertension: Secondary | ICD-10-CM | POA: Insufficient documentation

## 2015-02-12 DIAGNOSIS — Z86718 Personal history of other venous thrombosis and embolism: Secondary | ICD-10-CM | POA: Diagnosis not present

## 2015-02-12 DIAGNOSIS — Z79899 Other long term (current) drug therapy: Secondary | ICD-10-CM | POA: Diagnosis not present

## 2015-02-12 DIAGNOSIS — E669 Obesity, unspecified: Secondary | ICD-10-CM | POA: Diagnosis not present

## 2015-02-12 DIAGNOSIS — M5441 Lumbago with sciatica, right side: Secondary | ICD-10-CM | POA: Diagnosis not present

## 2015-02-12 DIAGNOSIS — Z87891 Personal history of nicotine dependence: Secondary | ICD-10-CM | POA: Insufficient documentation

## 2015-02-12 DIAGNOSIS — M5431 Sciatica, right side: Secondary | ICD-10-CM

## 2015-02-12 NOTE — ED Provider Notes (Signed)
CSN: [id number]     Arrival date & time 02/12/15  1042 History  This chart was scribed for non-physician practitioner, Roxy Horseman, PA-C working with Tilden Fossa, MD by Placido Sou, ED scribe. This patient was seen in room TR10C/TR10C and the patient's care was started at 11:28 AM.    Chief Complaint  Patient presents with  . Back Pain    The history is provided by the patient. No language interpreter was used.    HPI Comments: Jocelyn White is a 59 y.o. female, with a history of HTN and DVT, who presents to the Emergency Department complaining of constant, mild, radiating, right sided back pain with onset a week ago. She notes having sciatica and taking taking hydrocodone and prednisone for 5 days with no relief of symptoms. Able to ambulate.  Past Medical History  Diagnosis Date  . Hypertension   . Obesity   . DVT (deep venous thrombosis)    History reviewed. No pertinent past surgical history. Family History  Problem Relation Age of Onset  . Cancer Other   . Heart attack Other   . Stroke Other   . Diabetes Other    History  Substance Use Topics  . Smoking status: Former Games developer  . Smokeless tobacco: Not on file  . Alcohol Use: No   OB History    No data available     Review of Systems  Constitutional: Negative for fever and chills.  Gastrointestinal:       No bowel incontinence  Genitourinary:       No urinary incontinence  Musculoskeletal: Positive for myalgias, back pain and arthralgias.  Neurological:       No saddle anesthesia      Allergies  Review of patient's allergies indicates no known allergies.  Home Medications   Prior to Admission medications   Medication Sig Start Date End Date Taking? Authorizing Provider  acetaminophen (TYLENOL) 325 MG tablet Take 2 tablets (650 mg total) by mouth every 6 (six) hours as needed for mild pain (or Fever >/= 101). 04/30/14   Esperanza Sheets, MD  HYDROcodone-acetaminophen (NORCO) 5-325 MG per  tablet Take 1 tablet by mouth every 6 (six) hours as needed for moderate pain. 02/05/15   Tatyana Kirichenko, PA-C  levothyroxine (SYNTHROID, LEVOTHROID) 150 MCG tablet Take 150 mcg by mouth daily before breakfast.    Historical Provider, MD  lisinopril (PRINIVIL,ZESTRIL) 20 MG tablet Take 20 mg by mouth daily.    Historical Provider, MD  omeprazole (PRILOSEC) 40 MG capsule Take 40 mg by mouth daily.    Historical Provider, MD  predniSONE (DELTASONE) 50 MG tablet Take 1 tablet (50 mg total) by mouth daily. 02/05/15   Tatyana Kirichenko, PA-C   BP 132/75 mmHg  Pulse 65  Temp(Src) 98 F (36.7 C) (Oral)  Resp 20  SpO2 98% Physical Exam  Constitutional: She is oriented to person, place, and time. She appears well-developed and well-nourished. No distress.  HENT:  Head: Normocephalic and atraumatic.  Mouth/Throat: Oropharynx is clear and moist.  Eyes: Conjunctivae and EOM are normal. Pupils are equal, round, and reactive to light. Right eye exhibits no discharge. Left eye exhibits no discharge. No scleral icterus.  Neck: Normal range of motion. Neck supple. No tracheal deviation present.  Cardiovascular: Normal rate, regular rhythm and normal heart sounds.  Exam reveals no gallop and no friction rub.   No murmur heard. Pulmonary/Chest: Effort normal and breath sounds normal. No respiratory distress. She has no wheezes.  Abdominal: Soft.  She exhibits no distension. There is no tenderness.  Musculoskeletal: Normal range of motion.  Lumbar paraspinal muscles tender to palpation, no bony tenderness, step-offs, or gross abnormality or deformity of spine, patient is able to ambulate, moves all extremities  Bilateral great toe extension intact Bilateral plantar/dorsiflexion intact  Neurological: She is alert and oriented to person, place, and time.  Sensation and strength intact bilaterally   Skin: Skin is warm and dry. She is not diaphoretic.  Psychiatric: She has a normal mood and affect. Her  behavior is normal. Judgment and thought content normal.  Nursing note and vitals reviewed.   ED Course  Procedures  DIAGNOSTIC STUDIES: Oxygen Saturation is 98% on RA, normal by my interpretation.    COORDINATION OF CARE: 11:34 AM Discussed treatment plan including recommended physical therapy excercises with pt at bedside and pt agreed to plan.  Labs Review Labs Reviewed - No data to display  Imaging Review No results found.   EKG Interpretation None      MDM   Final diagnoses:  Sciatica, right    Patient with back pain.  No neurological deficits and normal neuro exam.  Patient is ambulatory.  No loss of bowel or bladder control.  Doubt cauda equina.  Denies fever,  doubt epidural abscess or other lesion. Recommend back exercises, stretching, RICE.  She is/has taken norco and prednisone in the past week. Will recommend neurosurg follow-up.  Encouraged the patient that there could be a need for additional workup and/or imaging such as MRI, if the symptoms do not resolve. Patient advised that if the back pain does not resolve, or radiates, this could progress to more serious conditions and is encouraged to follow-up with PCP or orthopedics within 2 weeks.     I personally performed the services described in this documentation, which was scribed in my presence. The recorded information has been reviewed and is accurate.     Roxy Horsemanobert Theodora Lalanne, PA-C 02/12/15 1147  Tilden FossaElizabeth Rees, MD 02/12/15 [phone]

## 2015-02-12 NOTE — Discharge Instructions (Signed)
Back Exercises Back exercises help treat and prevent back injuries. The goal of back exercises is to increase the strength of your abdominal and back muscles and the flexibility of your back. These exercises should be started when you no longer have back pain. Back exercises include:  Pelvic Tilt. Lie on your back with your knees bent. Tilt your pelvis until the lower part of your back is against the floor. Hold this position 5 to 10 sec and repeat 5 to 10 times.  Knee to Chest. Pull first 1 knee up against your chest and hold for 20 to 30 seconds, repeat this with the other knee, and then both knees. This may be done with the other leg straight or bent, whichever feels better.  Sit-Ups or Curl-Ups. Bend your knees 90 degrees. Start with tilting your pelvis, and do a partial, slow sit-up, lifting your trunk only 30 to 45 degrees off the floor. Take at least 2 to 3 seconds for each sit-up. Do not do sit-ups with your knees out straight. If partial sit-ups are difficult, simply do the above but with only tightening your abdominal muscles and holding it as directed.  Hip-Lift. Lie on your back with your knees flexed 90 degrees. Push down with your feet and shoulders as you raise your hips a couple inches off the floor; hold for 10 seconds, repeat 5 to 10 times.  Back arches. Lie on your stomach, propping yourself up on bent elbows. Slowly press on your hands, causing an arch in your low back. Repeat 3 to 5 times. Any initial stiffness and discomfort should lessen with repetition over time.  Shoulder-Lifts. Lie face down with arms beside your body. Keep hips and torso pressed to floor as you slowly lift your head and shoulders off the floor. Do not overdo your exercises, especially in the beginning. Exercises may cause you some mild back discomfort which lasts for a few minutes; however, if the pain is more severe, or lasts for more than 15 minutes, do not continue exercises until you see your caregiver.  Improvement with exercise therapy for back problems is slow.  See your caregivers for assistance with developing a proper back exercise program. Document Released: 10/02/2004 Document Revised: 11/17/2011 Document Reviewed: 06/26/2011 ExitCare Patient Information 2015 [employer]. This information is not intended to replace advice given to you by your health care provider. Make sure you discuss any questions you have with your health care provider.   Sciatica Sciatica is pain, weakness, numbness, or tingling along the path of the sciatic nerve. The nerve starts in the lower back and runs down the back of each leg. The nerve controls the muscles in the lower leg and in the back of the knee, while also providing sensation to the back of the thigh, lower leg, and the sole of your foot. Sciatica is a symptom of another medical condition. For instance, nerve damage or certain conditions, such as a herniated disk or bone spur on the spine, pinch or put pressure on the sciatic nerve. This causes the pain, weakness, or other sensations normally associated with sciatica. Generally, sciatica only affects one side of the body. CAUSES   Herniated or slipped disc.  Degenerative disk disease.  A pain disorder involving the narrow muscle in the buttocks (piriformis syndrome).  Pelvic injury or fracture.  Pregnancy.  Tumor (rare). SYMPTOMS  Symptoms can vary from mild to very severe. The symptoms usually travel from the low back to the buttocks and down the back   of the leg. Symptoms can include:  Mild tingling or dull aches in the lower back, leg, or hip.  Numbness in the back of the calf or sole of the foot.  Burning sensations in the lower back, leg, or hip.  Sharp pains in the lower back, leg, or hip.  Leg weakness.  Severe back pain inhibiting movement. These symptoms may get worse with coughing, sneezing, laughing, or prolonged sitting or standing. Also, being overweight may worsen  symptoms. DIAGNOSIS  Your caregiver will perform a physical exam to look for common symptoms of sciatica. He or she may ask you to do certain movements or activities that would trigger sciatic nerve pain. Other tests may be performed to find the cause of the sciatica. These may include:  Blood tests.  X-rays.  Imaging tests, such as an MRI or CT scan. TREATMENT  Treatment is directed at the cause of the sciatic pain. Sometimes, treatment is not necessary and the pain and discomfort goes away on its own. If treatment is needed, your caregiver may suggest:  Over-the-counter medicines to relieve pain.  Prescription medicines, such as anti-inflammatory medicine, muscle relaxants, or narcotics.  Applying heat or ice to the painful area.  Steroid injections to lessen pain, irritation, and inflammation around the nerve.  Reducing activity during periods of pain.  Exercising and stretching to strengthen your abdomen and improve flexibility of your spine. Your caregiver may suggest losing weight if the extra weight makes the back pain worse.  Physical therapy.  Surgery to eliminate what is pressing or pinching the nerve, such as a bone spur or part of a herniated disk. HOME CARE INSTRUCTIONS   Only take over-the-counter or prescription medicines for pain or discomfort as directed by your caregiver.  Apply ice to the affected area for 20 minutes, 3-4 times a day for the first 48-72 hours. Then try heat in the same way.  Exercise, stretch, or perform your usual activities if these do not aggravate your pain.  Attend physical therapy sessions as directed by your caregiver.  Keep all follow-up appointments as directed by your caregiver.  Do not wear high heels or shoes that do not provide proper support.  Check your mattress to see if it is too soft. A firm mattress may lessen your pain and discomfort. SEEK IMMEDIATE MEDICAL CARE IF:   You lose control of your bowel or bladder  (incontinence).  You have increasing weakness in the lower back, pelvis, buttocks, or legs.  You have redness or swelling of your back.  You have a burning sensation when you urinate.  You have pain that gets worse when you lie down or awakens you at night.  Your pain is worse than you have experienced in the past.  Your pain is lasting longer than 4 weeks.  You are suddenly losing weight without reason. MAKE SURE YOU:  Understand these instructions.  Will watch your condition.  Will get help right away if you are not doing well or get worse. Document Released: 08/19/2001 Document Revised: 02/24/2012 Document Reviewed: 01/04/2012 ExitCare Patient Information 2015 [employer]. This information is not intended to replace advice given to you by your health care provider. Make sure you discuss any questions you have with your health care provider.  

## 2015-02-12 NOTE — ED Notes (Signed)
PT reports the physical therapy starts WED.

## 2015-02-12 NOTE — ED Notes (Signed)
Pt here for right sided back pain. sts seen and dx with sciatica. sts suppose to have therapy soon ans pain meds not working.

## 2015-02-12 NOTE — ED Notes (Signed)
Pt changed her mind and ambulated to front lobby

## 2017-02-01 ENCOUNTER — Emergency Department (HOSPITAL_BASED_OUTPATIENT_CLINIC_OR_DEPARTMENT_OTHER)
Admission: EM | Admit: 2017-02-01 | Discharge: 2017-02-01 | Disposition: A | Discharge status: Home / Self Care | Payer: BLUE CROSS/BLUE SHIELD | Attending: Physician Assistant | Admitting: Physician Assistant

## 2017-02-01 ENCOUNTER — Encounter (HOSPITAL_BASED_OUTPATIENT_CLINIC_OR_DEPARTMENT_OTHER): Payer: Self-pay | Admitting: Emergency Medicine

## 2017-02-01 DIAGNOSIS — Z87891 Personal history of nicotine dependence: Secondary | ICD-10-CM | POA: Insufficient documentation

## 2017-02-01 DIAGNOSIS — I10 Essential (primary) hypertension: Secondary | ICD-10-CM | POA: Insufficient documentation

## 2017-02-01 DIAGNOSIS — Z79899 Other long term (current) drug therapy: Secondary | ICD-10-CM | POA: Diagnosis not present

## 2017-02-01 DIAGNOSIS — E039 Hypothyroidism, unspecified: Secondary | ICD-10-CM | POA: Insufficient documentation

## 2017-02-01 DIAGNOSIS — R42 Dizziness and giddiness: Secondary | ICD-10-CM | POA: Diagnosis not present

## 2017-02-01 HISTORY — DX: Disorder of thyroid, unspecified: E07.9

## 2017-02-01 HISTORY — DX: Gastro-esophageal reflux disease without esophagitis: K21.9

## 2017-02-01 LAB — URINALYSIS, ROUTINE W REFLEX MICROSCOPIC
Bilirubin Urine: NEGATIVE
Glucose, UA: NEGATIVE mg/dL
Hgb urine dipstick: NEGATIVE
Ketones, ur: NEGATIVE mg/dL
LEUKOCYTES UA: NEGATIVE
NITRITE: NEGATIVE
Protein, ur: NEGATIVE mg/dL
Specific Gravity, Urine: 1.008 (ref 1.005–1.030)
pH: 7.5 (ref 5.0–8.0)

## 2017-02-01 LAB — CBC WITH DIFFERENTIAL/PLATELET
BASOS ABS: 0 10*3/uL (ref 0.0–0.1)
Basophils Relative: 0 %
EOS ABS: 0.1 10*3/uL (ref 0.0–0.7)
Eosinophils Relative: 1 %
HCT: 39.4 % (ref 36.0–46.0)
Hemoglobin: 12.3 g/dL (ref 12.0–15.0)
LYMPHS PCT: 35 %
Lymphs Abs: 2.1 10*3/uL (ref 0.7–4.0)
MCH: 22 pg — ABNORMAL LOW (ref 26.0–34.0)
MCHC: 31.2 g/dL (ref 30.0–36.0)
MCV: 70.5 fL — ABNORMAL LOW (ref 78.0–100.0)
Monocytes Absolute: 0.4 10*3/uL (ref 0.1–1.0)
Monocytes Relative: 7 %
NEUTROS ABS: 3.5 10*3/uL (ref 1.7–7.7)
Neutrophils Relative %: 57 %
Platelets: 123 10*3/uL — ABNORMAL LOW (ref 150–400)
RBC: 5.59 MIL/uL — ABNORMAL HIGH (ref 3.87–5.11)
RDW: 17.6 % — AB (ref 11.5–15.5)
Smear Review: ADEQUATE
WBC: 6.1 10*3/uL (ref 4.0–10.5)

## 2017-02-01 LAB — BASIC METABOLIC PANEL
ANION GAP: 7 (ref 5–15)
BUN: 14 mg/dL (ref 6–20)
CALCIUM: 9 mg/dL (ref 8.9–10.3)
CO2: 28 mmol/L (ref 22–32)
Chloride: 106 mmol/L (ref 101–111)
Creatinine, Ser: 0.94 mg/dL (ref 0.44–1.00)
Glucose, Bld: 103 mg/dL — ABNORMAL HIGH (ref 65–99)
Potassium: 3.8 mmol/L (ref 3.5–5.1)
SODIUM: 141 mmol/L (ref 135–145)

## 2017-02-01 MED ORDER — SODIUM CHLORIDE 0.9 % IV BOLUS (SEPSIS): 1000.0000 mL | Freq: Once | INTRAVENOUS | Status: DC

## 2017-02-01 MED ORDER — SODIUM CHLORIDE 0.9 % IV BOLUS (SEPSIS)
1000.0000 mL | Freq: Once | INTRAVENOUS | Status: AC
  Administered 2017-02-01: 1000 mL via INTRAVENOUS

## 2017-02-01 MED ORDER — MECLIZINE HCL 25 MG PO TABS: 25.0000 mg | ORAL_TABLET | Freq: Three times a day (TID) | ORAL | 0 refills | Status: AC | PRN

## 2017-02-01 MED ORDER — MECLIZINE HCL 25 MG PO TABS
25.0000 mg | ORAL_TABLET | Freq: Once | ORAL | Status: AC
  Administered 2017-02-01: 25 mg via ORAL
  Filled 2017-02-01: qty 1

## 2017-02-01 NOTE — ED Notes (Addendum)
Pt able to ambulate to and from BR unassisted, in NAD. Denys dizziness. Refuses wheelchair.

## 2017-02-01 NOTE — Discharge Instructions (Signed)
Read the information below.  Use the prescribed medication as directed.  Please discuss all new medications with your pharmacist.  You may return to the Emergency Department at any time for worsening condition or any new symptoms that concern you.    ° ° °Your exam shows you have had an episode of vertigo, which causes a false sense of movement such as a spinning feeling or walls that seem to move.  Most vertigo is caused by a (usually temporary) problem in the inner ear. Rarely, the back part of the brain can cause vertigo (some mini-strokes / TIA's / strokes), but it appears to be a low risk cause for you at this time. It is important to follow-up with your doctor however, to see if you need further testing.  °Do not drive or participate in potentially dangerous activities requiring balance unless off meds (not drowsy) and the vertigo has resolved. °Most of the time benign vertigo is much better after a few days. However, mild unsteadiness may last for up to 3 months in some patients. An MRI scan or other special tests to evaluate your hearing and balance may be needed if the vertigo does not improve or returns in the future. RETURN IMMEDIATELY IF YOU HAVE ANY OF THE FOLLOWING (call 911): °Increasing vertigo, earache, ear drainage, or loss of hearing.  °Severe headache, blurred or double vision, or trouble walking.  °Fainting or poorly responsive, extreme weakness, chest pain, or palpitations.  °Fever, persistent vomiting, or dehydration.  °Numbness, tingling, incoordination, or weakness of the limbs.  °Change in speech, vision, swallowing, understanding, or other concerns.  °

## 2017-02-01 NOTE — ED Notes (Signed)
AMBULATING PATIENT: Tolerated well, denys any dizziness. HR 72 o2 SAT 98% states she feels much better.

## 2017-02-01 NOTE — ED Provider Notes (Signed)
[facility] Note   CSN: [id number] Arrival date & time: 02/01/17  1043     History   Chief Complaint Chief Complaint  Patient presents with  . Dizziness    HPI Jocelyn White is a 61 y.o. female.  HPI   Pt with hx HTN, obesity, DVT, thyroid disease p/w episode of vertigo that began this morning.  States she woke up at 2am, 3am with slight dizziness.  She went back to sleep and woke up at 8am with spinning.  She is fine at rest but spins with standing up and changing position.  States she the room spins around her and she has to hold on to keep from falling.  Denies any recent sick symptoms, fevers, URI, vomiting, diarrhea, urinary symptoms.  Denies recent medication changes.  She had a headache two days ago that lasted a few hours, frontal, throbbing, relieved with rest and tylenol, and has not recurred.    Past Medical History:  Diagnosis Date  . DVT (deep venous thrombosis) (HCC)   . GERD (gastroesophageal reflux disease)   . Hypertension   . Obesity   . Thyroid disease     Patient Active Problem List   Diagnosis Date Noted  . Chest pain 04/29/2014  . Benign essential hypertension 04/29/2014  . Hypothyroid 04/29/2014    History reviewed. No pertinent surgical history.  OB History    No data available       Home Medications    Prior to Admission medications   Medication Sig Start Date End Date Taking? Authorizing Provider  levothyroxine (SYNTHROID, LEVOTHROID) 175 MCG tablet Take 175 mcg by mouth daily. 01/19/15  Yes [provider]  omeprazole (PRILOSEC) 40 MG capsule Take 40 mg by mouth daily.   Yes [provider]  acetaminophen (TYLENOL) 325 MG tablet Take 2 tablets (650 mg total) by mouth every 6 (six) hours as needed for mild pain (or Fever >/= 101). 04/30/14   Buriev, Isaiah Serge, MD  HYDROcodone-acetaminophen (NORCO) 5-325 MG per tablet Take 1 tablet by mouth every 6 (six) hours as needed for moderate  pain. Patient taking differently: Take 1 tablet by mouth every 4 (four) hours as needed for moderate pain.  02/05/15   Kirichenko, Tatyana, PA-C  lisinopril (PRINIVIL,ZESTRIL) 20 MG tablet Take 20 mg by mouth daily.    [provider]  meclizine (ANTIVERT) 25 MG tablet Take 1 tablet (25 mg total) by mouth 3 (three) times daily as needed for dizziness. 02/01/17   Trixie Dredge, PA-C  predniSONE (DELTASONE) 50 MG tablet Take 1 tablet (50 mg total) by mouth daily. Patient not taking: Reported on 02/12/2015 02/05/15   Jaynie Crumble, PA-C    Family History Family History  Problem Relation Age of Onset  . Cancer Other   . Heart attack Other   . Stroke Other   . Diabetes Other     Social History Social History  Substance Use Topics  . Smoking status: Former Games developer  . Smokeless tobacco: Never Used  . Alcohol use No     Allergies   Patient has no known allergies.   Review of Systems Review of Systems  All other systems reviewed and are negative.    Physical Exam Updated Vital Signs BP 118/72   Pulse (!) 55   Temp 97.7 F (36.5 C) (Oral)   Resp 14   Ht 5\' 4"  (1.626 m)   Wt 114.8 kg (253 lb)   SpO2 98%   BMI 43.43 kg/m  Physical Exam  Constitutional: She appears well-developed and well-nourished. No distress.  HENT:  Head: Normocephalic and atraumatic.  Neck: Neck supple.  Cardiovascular: Normal rate and regular rhythm.   Pulmonary/Chest: Effort normal and breath sounds normal.  Neurological: She is alert.  CN II-XII intact, EOMs intact, no pronator drift, grip strengths equal bilaterally; strength 5/5 in all extremities, sensation intact in all extremities; finger to nose, heel to shin, rapid alternating movements normal.  Gait testing deferred.      Skin: She is not diaphoretic.  Nursing note and vitals reviewed.    ED Treatments / Results  Labs (all labs ordered are listed, but only abnormal results are displayed) Labs Reviewed  BASIC METABOLIC  PANEL - Abnormal; Notable for the following:       Result Value   Glucose, Bld 103 (*)    All other components within normal limits  CBC WITH DIFFERENTIAL/PLATELET - Abnormal; Notable for the following:    RBC 5.59 (*)    MCV 70.5 (*)    MCH 22.0 (*)    RDW 17.6 (*)    Platelets 123 (*)    All other components within normal limits  URINALYSIS, ROUTINE W REFLEX MICROSCOPIC    EKG  EKG Interpretation  Date/Time:  Sunday Feb 01 2017 12:50:31 EDT Ventricular Rate:  64 PR Interval:    QRS Duration: 96 QT Interval:  449 QTC Calculation: 464 R Axis:   47 Text Interpretation:  Sinus rhythm Borderline T abnormalities, anterior leads Normal sinus rhythm Confirmed by Corlis LeakMackuen, Courteney ([id number]) on 02/01/2017 12:53:20 PM Also confirmed by Corlis LeakMackuen, Courteney ([id number]), editor Misty StanleyScales-Price, Shannon [phone]  on 02/01/2017 2:24:39 PM       Radiology No results found.  Procedures Procedures (including critical care time)  Medications Ordered in ED Medications  sodium chloride 0.9 % bolus 1,000 mL (not administered)  sodium chloride 0.9 % bolus 1,000 mL (0 mLs Intravenous Stopped 02/01/17 1518)  meclizine (ANTIVERT) tablet 25 mg (25 mg Oral Given 02/01/17 1230)     Initial Impression / Assessment and Plan / ED Course  I have reviewed the triage vital signs and the nursing notes.  Pertinent labs & imaging results that were available during my care of the patient were reviewed by me and considered in my medical decision making (see chart for details).  Clinical Course as of Feb 01 1637  Wynelle LinkSun Feb 01, 2017  1348 Pt reports great improvement, will ambulate.   [EW]    Clinical Course User Index [EW] ChadWest, Ander Wamser, [place]PA-C    Afebrile, nontoxic patient with symptoms of vertigo that is positional and associated with movement and walking, improved with IVF and meclizine.  No focal neurologic deficits on exam.  Gait normal after treatment.  Workup unremarkable.   D/C home with meclizine, PCP follow  up.  Discussed result, findings, treatment, and follow up  with patient.  Pt given return precautions.  Pt verbalizes understanding and agrees with plan.       Final Clinical Impressions(s) / ED Diagnoses   Final diagnoses:  Vertigo    New Prescriptions Discharge Medication List as of 02/01/2017  2:39 PM    START taking these medications   Details  meclizine (ANTIVERT) 25 MG tablet Take 1 tablet (25 mg total) by mouth 3 (three) times daily as needed for dizziness., Starting Sun 02/01/2017, Print         Trixie DredgeWest, Shariah Assad, PA-C 02/01/17 1639    Abelino DerrickMackuen, Courteney Lyn, MD 02/03/17 1148

## 2017-02-01 NOTE — ED Notes (Signed)
Pt ambulatory to BR without assistance, in NAD. 

## 2017-02-01 NOTE — ED Notes (Signed)
Pt on cardiac monitor and auto VS 

## 2017-02-01 NOTE — ED Triage Notes (Signed)
Nausea and h/a on Friday and at 0100 this am has been feeling like the room is spinning. Hx of vertigo and feels the same, x 5 years ago . Denies nausea or h/a today.

## 2017-02-01 NOTE — ED Notes (Signed)
Report given to Sue RN

## 2017-06-19 DIAGNOSIS — E039 Hypothyroidism, unspecified: Secondary | ICD-10-CM | POA: Diagnosis not present

## 2017-06-19 DIAGNOSIS — Z23 Encounter for immunization: Secondary | ICD-10-CM | POA: Diagnosis not present

## 2017-06-19 DIAGNOSIS — I10 Essential (primary) hypertension: Secondary | ICD-10-CM | POA: Diagnosis not present

## 2017-06-19 DIAGNOSIS — K219 Gastro-esophageal reflux disease without esophagitis: Secondary | ICD-10-CM | POA: Diagnosis not present

## 2017-07-15 DIAGNOSIS — Z1231 Encounter for screening mammogram for malignant neoplasm of breast: Secondary | ICD-10-CM | POA: Diagnosis not present

## 2017-10-23 DIAGNOSIS — M25572 Pain in left ankle and joints of left foot: Secondary | ICD-10-CM | POA: Diagnosis not present

## 2017-10-23 DIAGNOSIS — L309 Dermatitis, unspecified: Secondary | ICD-10-CM | POA: Diagnosis not present

## 2017-10-23 DIAGNOSIS — R42 Dizziness and giddiness: Secondary | ICD-10-CM | POA: Diagnosis not present

## 2017-12-03 ENCOUNTER — Ambulatory Visit (INDEPENDENT_AMBULATORY_CARE_PROVIDER_SITE_OTHER): Payer: BLUE CROSS/BLUE SHIELD | Admitting: Podiatry

## 2017-12-03 ENCOUNTER — Ambulatory Visit (INDEPENDENT_AMBULATORY_CARE_PROVIDER_SITE_OTHER): Payer: BLUE CROSS/BLUE SHIELD

## 2017-12-03 DIAGNOSIS — M779 Enthesopathy, unspecified: Secondary | ICD-10-CM | POA: Diagnosis not present

## 2017-12-03 DIAGNOSIS — S93401A Sprain of unspecified ligament of right ankle, initial encounter: Secondary | ICD-10-CM

## 2017-12-03 MED ORDER — MELOXICAM 7.5 MG PO TABS: 7.5000 mg | ORAL_TABLET | Freq: Every day | ORAL | 0 refills | Status: DC

## 2017-12-03 NOTE — Patient Instructions (Addendum)
Posterior Tibial Tendon Tear Rehab Ask your health care provider which exercises are safe for you. Do exercises exactly as told by your health care provider and adjust them as directed. It is normal to feel mild stretching, pulling, tightness, or discomfort as you do these exercises, but you should stop right away if you feel sudden pain or your pain gets worse.Do not begin these exercises until told by your health care provider. Stretching and range of motion exercises These exercises warm up your muscles and joints and improve the movement and flexibility of your ankle. These exercises also help to relieve pain, numbness, and tingling. Exercise A: Gastroc and soleus stretch  1. Sit on the floor with your left / right leg extended. 2. Loop a belt or towel around ball of your left / right foot. The ball of your foot is on the walking surface, right under your toes. 3. Keep your left / right ankle and foot relaxed and keep your knee straight while you use the belt or towel to pull your foot and ankle toward you. You should feel a gentle stretch behind your calf or knee. 4. Hold this position for __________ seconds. Repeat __________ times. Complete this exercise __________ times a day. Exercise B: Dorsiflexion/plantar flexion  1. Sit with your left / right knee straight or bent. 2. Flex your left / right ankle to tilt the top of your foot toward your shin. 3. Hold this position for __________ seconds. 4. Point your toes downward to tilt the top of your foot away from your shin. 5. Hold this position for __________ seconds. Repeat __________ times with your knee straight and __________ times with your knee bent. Complete this exercise __________ times a day. Exercise C: Ankle plantar flexion, passive  1. Sit with your left / right leg crossed over your opposite knee. 2. Use your opposite hand to pull the top of your foot and toes toward you. You should feel a gentle stretch on the top of your  foot and ankle. 3. Hold this position for __________ seconds. Repeat __________ times. Complete this exercise __________ times a day. Exercise D: Ankle eversion  1. Sit with your left / right ankle crossed over your opposite knee. 2. Grip your left / right foot with your opposite hand, with your thumb on the top of your foot and with your fingers on the bottom of your foot. 3. Gently push your foot downward with a slight rotation so the smallest toes rise slightly toward the ceiling. You should feel a gentle stretch on the inside of your ankle. 4. Hold this stretch for __________ seconds. Repeat __________ times. Complete this exercise __________ times a day. Exercise E: Ankle inversion  1. Sit with your left / right ankle crossed over your opposite knee. 2. Hold your left / right foot with your opposite hand, with your thumb on the bottom of your foot and your fingers on the top of your foot. 3. Gently pull your foot. Your smallest toe should come toward you, and your thumb should be pushing against the ball of your foot. You should feel a gentle stretch on the outside of your ankle. 4. Hold the stretch for __________ seconds. Repeat __________ times. Complete this exercise __________ times a day. Exercise F: Ankle alphabet  1. Sit with your left / right leg supported at the lower leg. ? Do not rest your foot on anything. ? Make sure your foot has room to move freely. 2. Think of your   left / right foot as a paintbrush, and move your foot to trace each letter of the alphabet in the air. Keep your hip and knee still while you trace. 3. Trace every letter from A to Z. Repeat __________ times. Complete this exercise __________ times a day. Strengthening exercises These exercises build strength and endurance in your lower leg. Endurance is the ability to use your muscles for a long time, even after they get tired. Exercise G: Dorsiflexors  1. Secure a rubber exercise band or tube to an  object that will not move if it is pulled on, such as a table leg. 2. Secure the other end of the band around your left / right foot. 3. Sit on the floor, facing the object with your left / right leg extended. The band or tube should be slightly tense when your foot is relaxed. 4. Slowly flex your left / right ankle and toes to bring your foot toward you. 5. Hold this position for __________ seconds. 6. Let the band or tube slowly pull your foot back to the starting position. Repeat __________ times. Complete this exercise __________ times a day. Exercise H: Plantar flexors  1. Sit on the floor with your left / right leg extended. 2. Loop a rubber exercise band or tube around the ball of your __________ foot. The ball of your foot is on the walking surface, right under your toes. The band or tube should be slightly tense when your foot is relaxed. 3. Slowly point your toes downward, pushing them away from you. 4. Hold this position for __________ seconds. 5. Let the band or tube slowly pull your foot back to the starting position. Repeat __________ times. Complete this exercise __________ times a day. Exercise I: Towel curls  1. Sit in a chair on a non-carpeted surface, and put your feet on the floor. 2. Place a towel in front of your feet. If told by your health care provider, add __________ to the end of the towel. 3. Keeping your heel on the floor, put your left / right foot on the towel. 4. Pull the towel toward you by grabbing the towel with your toes and curling them under. Keep your heel on the floor. Repeat __________ times. Complete this exercise __________ times a day. This information is not intended to replace advice given to you by your health care provider. Make sure you discuss any questions you have with your health care provider. Document Released: 08/25/2005 Document Revised: 05/01/2016 Document Reviewed: 08/19/2015 Elsevier Interactive Patient Education  2018 [employer].   Meloxicam tablets What is this medicine? MELOXICAM (mel OX i cam) is a non-steroidal anti-inflammatory drug (NSAID). It is used to reduce swelling and to treat pain. It may be used for osteoarthritis, rheumatoid arthritis, or juvenile rheumatoid arthritis. This medicine may be used for other purposes; ask your health care provider or pharmacist if you have questions. COMMON BRAND NAME(S): Mobic What should I tell my health care provider before I take this medicine? They need to know if you have any of these conditions: -bleeding disorders -cigarette smoker -coronary artery bypass graft (CABG) surgery within the past 2 weeks -drink more than 3 alcohol-containing drinks per day -heart disease -high blood pressure -history of stomach bleeding -kidney disease -liver disease -lung or breathing disease, like asthma -stomach or intestine problems -an unusual or allergic reaction to meloxicam, aspirin, other NSAIDs, other medicines, foods, dyes, or preservatives -pregnant or trying to get pregnant -breast-feeding How should I  use this medicine? Take this medicine by mouth with a full glass of water. Follow the directions on the prescription label. You can take it with or without food. If it upsets your stomach, take it with food. Take your medicine at regular intervals. Do not take it more often than directed. Do not stop taking except on your doctor's advice. A special MedGuide will be given to you by the pharmacist with each prescription and refill. Be sure to read this information carefully each time. Talk to your pediatrician regarding the use of this medicine in children. While this drug may be prescribed for selected conditions, precautions do apply. Patients over 63 years old may have a stronger reaction and need a smaller dose. Overdosage: If you think you have taken too much of this medicine contact a poison control center or emergency room at once. NOTE: This medicine is only  for you. Do not share this medicine with others. What if I miss a dose? If you miss a dose, take it as soon as you can. If it is almost time for your next dose, take only that dose. Do not take double or extra doses. What may interact with this medicine? Do not take this medicine with any of the following medications: -cidofovir -ketorolac This medicine may also interact with the following medications: -aspirin and aspirin-like medicines -certain medicines for blood pressure, heart disease, irregular heart beat -certain medicines for depression, anxiety, or psychotic disturbances -certain medicines that treat or prevent blood clots like warfarin, enoxaparin, dalteparin, apixaban, dabigatran, rivaroxaban -cyclosporine -digoxin -diuretics -methotrexate -other NSAIDs, medicines for pain and inflammation, like ibuprofen and naproxen -pemetrexed This list may not describe all possible interactions. Give your health care provider a list of all the medicines, herbs, non-prescription drugs, or dietary supplements you use. Also tell them if you smoke, drink alcohol, or use illegal drugs. Some items may interact with your medicine. What should I watch for while using this medicine? Tell your doctor or healthcare professional if your symptoms do not start to get better or if they get worse. Do not take other medicines that contain aspirin, ibuprofen, or naproxen with this medicine. Side effects such as stomach upset, nausea, or ulcers may be more likely to occur. Many medicines available without a prescription should not be taken with this medicine. This medicine can cause ulcers and bleeding in the stomach and intestines at any time during treatment. This can happen with no warning and may cause death. There is increased risk with taking this medicine for a long time. Smoking, drinking alcohol, older age, and poor health can also increase risks. Call your doctor right away if you have stomach pain or  blood in your vomit or stool. This medicine does not prevent heart attack or stroke. In fact, this medicine may increase the chance of a heart attack or stroke. The chance may increase with longer use of this medicine and in people who have heart disease. If you take aspirin to prevent heart attack or stroke, talk with your doctor or health care professional. What side effects may I notice from receiving this medicine? Side effects that you should report to your doctor or health care professional as soon as possible: -allergic reactions like skin rash, itching or hives, swelling of the face, lips, or tongue -nausea, vomiting -signs and symptoms of a blood clot such as breathing problems; changes in vision; chest pain; severe, sudden headache; pain, swelling, warmth in the leg; trouble speaking; sudden numbness or weakness  of the face, arm, or leg -signs and symptoms of bleeding such as bloody or black, tarry stools; red or dark-brown urine; spitting up blood or brown material that looks like coffee grounds; red spots on the skin; unusual bruising or bleeding from the eye, gums, or nose -signs and symptoms of liver injury like dark yellow or brown urine; general ill feeling or flu-like symptoms; light-colored stools; loss of appetite; nausea; right upper belly pain; unusually weak or tired; yellowing of the eyes or skin -signs and symptoms of stroke like changes in vision; confusion; trouble speaking or understanding; severe headaches; sudden numbness or weakness of the face, arm, or leg; trouble walking; dizziness; loss of balance or coordination Side effects that usually do not require medical attention (report to your doctor or health care professional if they continue or are bothersome): -constipation -diarrhea -gas This list may not describe all possible side effects. Call your doctor for medical advice about side effects. You may report side effects to FDA at 1-800-FDA-1088. Where should I keep  my medicine? Keep out of the reach of children. Store at room temperature between 15 and 30 degrees C (59 and 86 degrees F). Throw away any unused medicine after the expiration date. NOTE: This sheet is a summary. It may not cover all possible information. If you have questions about this medicine, talk to your doctor, pharmacist, or health care provider.  2018 Elsevier/Gold Standard (2015-09-26 19:28:16)

## 2017-12-04 NOTE — Progress Notes (Signed)
Subjective:   Patient ID: Jocelyn White, female   DOB: 62 y.o.   MRN: [id number]   HPI 62 year old female presents the office today for concerns of pain to the inside aspect of the right ankle which started a month ago.  She states that she has no pain today initially the appointment her pain started to improve.  She states this may have started when she change her shoes but she denies any recent injury or trauma she denies any swelling or redness.  No numbness or tingling.  She has no pain at all today on exam or walking then.  She has no other concerns today.   Review of Systems  All other systems reviewed and are negative.  Past Medical History:  Diagnosis Date  . DVT (deep venous thrombosis) (HCC)   . GERD (gastroesophageal reflux disease)   . Hypertension   . Obesity   . Thyroid disease     No past surgical history on file.   Current Outpatient Medications:  .  acetaminophen (TYLENOL) 325 MG tablet, Take 2 tablets (650 mg total) by mouth every 6 (six) hours as needed for mild pain (or Fever >/= 101)., Disp: , Rfl:  .  HYDROcodone-acetaminophen (NORCO) 5-325 MG per tablet, Take 1 tablet by mouth every 6 (six) hours as needed for moderate pain. (Patient taking differently: Take 1 tablet by mouth every 4 (four) hours as needed for moderate pain. ), Disp: 20 tablet, Rfl: 0 .  levothyroxine (SYNTHROID, LEVOTHROID) 175 MCG tablet, Take 175 mcg by mouth daily., Disp: , Rfl: 2 .  lisinopril (PRINIVIL,ZESTRIL) 20 MG tablet, Take 20 mg by mouth daily., Disp: , Rfl:  .  meclizine (ANTIVERT) 25 MG tablet, Take 1 tablet (25 mg total) by mouth 3 (three) times daily as needed for dizziness., Disp: 20 tablet, Rfl: 0 .  meloxicam (MOBIC) 7.5 MG tablet, Take 1 tablet (7.5 mg total) by mouth daily., Disp: 14 tablet, Rfl: 0 .  omeprazole (PRILOSEC) 40 MG capsule, Take 40 mg by mouth daily., Disp: , Rfl:  .  predniSONE (DELTASONE) 50 MG tablet, Take 1 tablet (50 mg total) by mouth daily. (Patient  not taking: Reported on 02/12/2015), Disp: 5 tablet, Rfl: 0  No Known Allergies  Social History   Socioeconomic History  . Marital status: Married    Spouse name: Not on file  . Number of children: Not on file  . Years of education: Not on file  . Highest education level: Not on file  Occupational History  . Not on file  Social Needs  . Financial resource strain: Not on file  . Food insecurity:    Worry: Not on file    Inability: Not on file  . Transportation needs:    Medical: Not on file    Non-medical: Not on file  Tobacco Use  . Smoking status: Former Games developer  . Smokeless tobacco: Never Used  Substance and Sexual Activity  . Alcohol use: No  . Drug use: No  . Sexual activity: Not on file  Lifestyle  . Physical activity:    Days per week: Not on file    Minutes per session: Not on file  . Stress: Not on file  Relationships  . Social connections:    Talks on phone: Not on file    Gets together: Not on file    Attends religious service: Not on file    Active member of club or organization: Not on file    Attends meetings of  clubs or organizations: Not on file    Relationship status: Not on file  . Intimate partner violence:    Fear of current or ex partner: Not on file    Emotionally abused: Not on file    Physically abused: Not on file    Forced sexual activity: Not on file  Other Topics Concern  . Not on file  Social History Narrative  . Not on file       Objective:  Physical Exam  General: AAO x3, NAD  Dermatological: Skin is warm, dry and supple bilateral. Nails x 10 are well manicured; remaining integument appears unremarkable at this time. There are no open sores, no preulcerative lesions, no rash or signs of infection present.  Vascular: Dorsalis Pedis artery and Posterior Tibial artery pedal pulses are 2/4 bilateral with immedate capillary fill time. Pedal hair growth present. No varicosities and no lower extremity edema present bilateral. There is no  pain with calf compression, swelling, warmth, erythema.   Neruologic: Grossly intact via light touch bilateral. Vibratory intact via tuning fork bilateral. Protective threshold with Semmes Wienstein monofilament intact to all pedal sites bilateral.  Negative Tinel sign. Musculoskeletal: At this time there is no area pinpoint bony tenderness or pain to vibratory sensation.  There is no pain to the Achilles tendon, plantar fascia, flexor, extensor tendons.  Upon palpation of the medial aspect the ankle this is subjectively where she had tenderness and is here to be along the course of the posterior tibial tendon.  There is no tenderness to palpation today and there is chronic bilateral lower extremity which is normal for her but there is been no increased she reports.  No redness or warmth.  Decreased medial arch height.  Muscular strength 5/5 in all groups tested bilateral.  Gait: Unassisted, Nonantalgic.       Assessment:   Likely resolved tendinitis    Plan:  -Treatment options discussed including all alternatives, risks, and complications -Etiology of symptoms were discussed -X-rays were obtained and reviewed with the patient.  No evidence of acute fracture or stress fracture identified today. -We discussed the change in shoes.  We also discussed stretching, rehab exercises.  Ice to the area afterwards. Discussed anti-inflammatories as needed.  I did prescribe meloxicam to take if needed for pain if there is any reoccurrence at her request.  However if she starts to get pain back and is not improving she is to call the office for follow-up.  Vivi BarrackMatthew R Wagoner DPM

## 2017-12-18 DIAGNOSIS — E7800 Pure hypercholesterolemia, unspecified: Secondary | ICD-10-CM | POA: Diagnosis not present

## 2017-12-18 DIAGNOSIS — K219 Gastro-esophageal reflux disease without esophagitis: Secondary | ICD-10-CM | POA: Diagnosis not present

## 2017-12-18 DIAGNOSIS — Z1159 Encounter for screening for other viral diseases: Secondary | ICD-10-CM | POA: Diagnosis not present

## 2017-12-18 DIAGNOSIS — E039 Hypothyroidism, unspecified: Secondary | ICD-10-CM | POA: Diagnosis not present

## 2017-12-18 DIAGNOSIS — I10 Essential (primary) hypertension: Secondary | ICD-10-CM | POA: Diagnosis not present

## 2017-12-28 DIAGNOSIS — J018 Other acute sinusitis: Secondary | ICD-10-CM | POA: Diagnosis not present

## 2018-03-19 DIAGNOSIS — N3001 Acute cystitis with hematuria: Secondary | ICD-10-CM | POA: Diagnosis not present

## 2018-03-19 DIAGNOSIS — R350 Frequency of micturition: Secondary | ICD-10-CM | POA: Diagnosis not present

## 2018-03-19 DIAGNOSIS — R300 Dysuria: Secondary | ICD-10-CM | POA: Diagnosis not present

## 2018-03-28 DIAGNOSIS — J029 Acute pharyngitis, unspecified: Secondary | ICD-10-CM | POA: Diagnosis not present

## 2018-05-11 DIAGNOSIS — R635 Abnormal weight gain: Secondary | ICD-10-CM | POA: Diagnosis not present

## 2018-05-11 DIAGNOSIS — Z1211 Encounter for screening for malignant neoplasm of colon: Secondary | ICD-10-CM | POA: Diagnosis not present

## 2018-05-11 DIAGNOSIS — K219 Gastro-esophageal reflux disease without esophagitis: Secondary | ICD-10-CM | POA: Diagnosis not present

## 2018-05-18 ENCOUNTER — Other Ambulatory Visit: Payer: Self-pay | Admitting: Gastroenterology

## 2018-05-18 DIAGNOSIS — D696 Thrombocytopenia, unspecified: Secondary | ICD-10-CM

## 2018-05-25 ENCOUNTER — Ambulatory Visit (HOSPITAL_COMMUNITY)
Admission: RE | Admit: 2018-05-25 | Discharge: 2018-05-25 | Disposition: A | Discharge status: Home / Self Care | Payer: BLUE CROSS/BLUE SHIELD | Source: Ambulatory Visit | Attending: Gastroenterology | Admitting: Gastroenterology

## 2018-05-25 DIAGNOSIS — D696 Thrombocytopenia, unspecified: Secondary | ICD-10-CM | POA: Insufficient documentation

## 2018-05-25 DIAGNOSIS — K7689 Other specified diseases of liver: Secondary | ICD-10-CM | POA: Insufficient documentation

## 2018-06-02 DIAGNOSIS — Z1211 Encounter for screening for malignant neoplasm of colon: Secondary | ICD-10-CM | POA: Diagnosis not present

## 2018-07-13 DIAGNOSIS — Z1231 Encounter for screening mammogram for malignant neoplasm of breast: Secondary | ICD-10-CM | POA: Diagnosis not present

## 2018-07-16 DIAGNOSIS — K219 Gastro-esophageal reflux disease without esophagitis: Secondary | ICD-10-CM | POA: Diagnosis not present

## 2018-07-16 DIAGNOSIS — I10 Essential (primary) hypertension: Secondary | ICD-10-CM | POA: Diagnosis not present

## 2018-07-16 DIAGNOSIS — Z23 Encounter for immunization: Secondary | ICD-10-CM | POA: Diagnosis not present

## 2018-07-16 DIAGNOSIS — E039 Hypothyroidism, unspecified: Secondary | ICD-10-CM | POA: Diagnosis not present

## 2018-08-03 DIAGNOSIS — H00012 Hordeolum externum right lower eyelid: Secondary | ICD-10-CM | POA: Diagnosis not present

## 2018-08-03 DIAGNOSIS — H04123 Dry eye syndrome of bilateral lacrimal glands: Secondary | ICD-10-CM | POA: Diagnosis not present

## 2018-12-01 DIAGNOSIS — M436 Torticollis: Secondary | ICD-10-CM | POA: Diagnosis not present

## 2019-01-14 DIAGNOSIS — I10 Essential (primary) hypertension: Secondary | ICD-10-CM | POA: Diagnosis not present

## 2019-01-14 DIAGNOSIS — K219 Gastro-esophageal reflux disease without esophagitis: Secondary | ICD-10-CM | POA: Diagnosis not present

## 2019-01-14 DIAGNOSIS — E039 Hypothyroidism, unspecified: Secondary | ICD-10-CM | POA: Diagnosis not present

## 2019-02-01 ENCOUNTER — Ambulatory Visit: Payer: BLUE CROSS/BLUE SHIELD | Admitting: Podiatry

## 2019-02-01 ENCOUNTER — Encounter: Payer: Self-pay | Admitting: Podiatry

## 2019-02-01 ENCOUNTER — Other Ambulatory Visit: Payer: Self-pay

## 2019-02-01 VITALS — Temp 97.5°F

## 2019-02-01 DIAGNOSIS — I10 Essential (primary) hypertension: Secondary | ICD-10-CM | POA: Diagnosis not present

## 2019-02-01 DIAGNOSIS — E039 Hypothyroidism, unspecified: Secondary | ICD-10-CM | POA: Diagnosis not present

## 2019-02-01 DIAGNOSIS — L601 Onycholysis: Secondary | ICD-10-CM

## 2019-02-01 DIAGNOSIS — E7800 Pure hypercholesterolemia, unspecified: Secondary | ICD-10-CM | POA: Diagnosis not present

## 2019-02-01 DIAGNOSIS — R252 Cramp and spasm: Secondary | ICD-10-CM | POA: Diagnosis not present

## 2019-02-01 DIAGNOSIS — B351 Tinea unguium: Secondary | ICD-10-CM | POA: Diagnosis not present

## 2019-02-01 DIAGNOSIS — L608 Other nail disorders: Secondary | ICD-10-CM | POA: Diagnosis not present

## 2019-02-01 NOTE — Progress Notes (Signed)
Subjective: 63 year old female presents the office today for concerns of fungus to both of her big toenails.  She states that they have been dark in color and they are starting to loosen the underlying nail bed.  She does get some discomfort of the nail corners but she denies any drainage or pus coming from the toenail denies any swelling or redness.  No recent treatment. Denies any systemic complaints such as fevers, chills, nausea, vomiting. No acute changes since last appointment, and no other complaints at this time.   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  There is loose from the underlying nail bed except only.  On the proximal aspect.  There is tenderness on the nail corners as well as the distal portion of the nail.  There is no edema, erythema, drainage or pus or any clinical signs of infection noted today. No open lesions or pre-ulcerative lesions.  No pain with calf compression, swelling, warmth, erythema  Assessment: Onychodystrophy, onycholysis  Plan: -All treatment options discussed with the patient including all alternatives, risks, complications.  -Today we discussed options.  Discussed total removal.  After discussion I debrided the toenails so any complications or bleeding treatment is much the toenail right foot and left the very proximal aspect intact.  Monitoring signs or symptoms of infection or ingrowing nails.  I sent the nails for culture, pathology to [facility] labs.  Discussed treatment options for nail fungus will await the results of the culture before proceeding with definitive treatment. -Patient encouraged to call the office with any questions, concerns, change in symptoms.   Vivi Barrack DPM

## 2019-02-01 NOTE — Patient Instructions (Signed)
Soak Instructions    THE DAY AFTER THE PROCEDURE  Place 1/4 cup of epsom salts in a quart of warm tap water.  Submerge your foot or feet with outer bandage intact for the initial soak; this will allow the bandage to become moist and wet for easy lift off.  Once you remove your bandage, continue to soak in the solution for 20 minutes.  This soak should be done twice a day.  Next, remove your foot or feet from solution, blot dry the affected area and cover.  You may use a band aid large enough to cover the area or use gauze and tape.  Apply other medications to the area as directed by the doctor such as polysporin neosporin.  IF YOUR SKIN BECOMES IRRITATED WHILE USING THESE INSTRUCTIONS, IT IS OKAY TO SWITCH TO  WHITE VINEGAR AND WATER. Or you may use antibacterial soap and water to keep the toe clean  Monitor for any signs/symptoms of infection. Call the office immediately if any occur or go directly to the emergency room. Call with any questions/concerns.  Onychomycosis/Fungal Toenails  WHAT IS IT? An infection that lies within the keratin of your nail plate that is caused by a fungus.  WHY ME? Fungal infections affect all ages, sexes, races, and creeds.  There may be many factors that predispose you to a fungal infection such as age, coexisting medical conditions such as diabetes, or an autoimmune disease; stress, medications, fatigue, genetics, etc.  Bottom line: fungus thrives in a warm, moist environment and your shoes offer such a location.  IS IT CONTAGIOUS? Theoretically, yes.  You do not want to share shoes, nail clippers or files with someone who has fungal toenails.  Walking around barefoot in the same room or sleeping in the same bed is unlikely to transfer the organism.  It is important to realize, however, that fungus can spread easily from one nail to the next on the same foot.  HOW DO WE TREAT THIS?  There are several ways to treat this condition.  Treatment may depend on many  factors such as age, medications, pregnancy, liver and kidney conditions, etc.  It is best to ask your doctor which options are available to you.  1. No treatment.   Unlike many other medical concerns, you can live with this condition.  However for many people this can be a painful condition and may lead to ingrown toenails or a bacterial infection.  It is recommended that you keep the nails cut short to help reduce the amount of fungal nail. 2. Topical treatment.  These range from herbal remedies to prescription strength nail lacquers.  About 40-50% effective, topicals require twice daily application for approximately 9 to 12 months or until an entirely new nail has grown out.  The most effective topicals are medical grade medications available through physicians offices. 3. Oral antifungal medications.  With an 80-90% cure rate, the most common oral medication requires 3 to 4 months of therapy and stays in your system for a year as the new nail grows out.  Oral antifungal medications do require blood work to make sure it is a safe drug for you.  A liver function panel will be performed prior to starting the medication and after the first month of treatment.  It is important to have the blood work performed to avoid any harmful side effects.  In general, this medication safe but blood work is required. 4. Laser Therapy.  This treatment is performed by  applying a specialized laser to the affected nail plate.  This therapy is noninvasive, fast, and non-painful.  It is not covered by insurance and is therefore, out of pocket.  The results have been very good with a 80-95% cure rate.  The [facility] is the only practice in the area to offer this therapy. 5. Permanent Nail Avulsion.  Removing the entire nail so that a new nail will not grow back.

## 2019-02-18 ENCOUNTER — Telehealth: Payer: Self-pay | Admitting: *Deleted

## 2019-02-18 NOTE — Telephone Encounter (Signed)
Pt called for results. Left message informing pt I was calling again to discuss results of the test and I would try to call again before 2:00pm or call again Monday.

## 2019-02-18 NOTE — Telephone Encounter (Signed)
-----   Message from Trula Slade, DPM sent at 02/14/2019  8:19 PM EDT ----- Val- please let her know that the fungal culture did not show fungus. I would recommend urea cream and biotin supplements. Use only a small amount of urea cream because the nail is already loose it may come off.

## 2019-02-18 NOTE — Telephone Encounter (Signed)
Left message on mobile and home phone to call for results.

## 2019-02-21 NOTE — Telephone Encounter (Signed)
Pt called for results.

## 2019-02-21 NOTE — Telephone Encounter (Signed)
Left message informing pt of Dr. Leigh Aurora review of results and recommendation to use urea cream.

## 2019-06-21 DIAGNOSIS — L1230 Acquired epidermolysis bullosa, unspecified: Secondary | ICD-10-CM | POA: Diagnosis not present

## 2019-06-21 DIAGNOSIS — L309 Dermatitis, unspecified: Secondary | ICD-10-CM | POA: Diagnosis not present

## 2019-06-21 DIAGNOSIS — M25561 Pain in right knee: Secondary | ICD-10-CM | POA: Diagnosis not present

## 2019-06-21 DIAGNOSIS — Z23 Encounter for immunization: Secondary | ICD-10-CM | POA: Diagnosis not present

## 2019-07-14 DIAGNOSIS — Z1231 Encounter for screening mammogram for malignant neoplasm of breast: Secondary | ICD-10-CM | POA: Diagnosis not present

## 2019-07-20 DIAGNOSIS — K219 Gastro-esophageal reflux disease without esophagitis: Secondary | ICD-10-CM | POA: Diagnosis not present

## 2019-07-20 DIAGNOSIS — E039 Hypothyroidism, unspecified: Secondary | ICD-10-CM | POA: Diagnosis not present

## 2019-07-20 DIAGNOSIS — I10 Essential (primary) hypertension: Secondary | ICD-10-CM | POA: Diagnosis not present

## 2019-07-20 DIAGNOSIS — L309 Dermatitis, unspecified: Secondary | ICD-10-CM | POA: Diagnosis not present

## 2019-07-27 DIAGNOSIS — I10 Essential (primary) hypertension: Secondary | ICD-10-CM | POA: Diagnosis not present

## 2019-11-16 DIAGNOSIS — M79604 Pain in right leg: Secondary | ICD-10-CM | POA: Diagnosis not present

## 2019-12-14 DIAGNOSIS — H00015 Hordeolum externum left lower eyelid: Secondary | ICD-10-CM | POA: Diagnosis not present

## 2020-01-19 DIAGNOSIS — K219 Gastro-esophageal reflux disease without esophagitis: Secondary | ICD-10-CM | POA: Diagnosis not present

## 2020-01-19 DIAGNOSIS — I10 Essential (primary) hypertension: Secondary | ICD-10-CM | POA: Diagnosis not present

## 2020-01-19 DIAGNOSIS — E7800 Pure hypercholesterolemia, unspecified: Secondary | ICD-10-CM | POA: Diagnosis not present

## 2020-01-19 DIAGNOSIS — E039 Hypothyroidism, unspecified: Secondary | ICD-10-CM | POA: Diagnosis not present

## 2020-01-24 DIAGNOSIS — M1711 Unilateral primary osteoarthritis, right knee: Secondary | ICD-10-CM | POA: Diagnosis not present

## 2020-04-02 ENCOUNTER — Ambulatory Visit: Payer: BLUE CROSS/BLUE SHIELD | Admitting: Podiatry

## 2020-04-02 ENCOUNTER — Ambulatory Visit (INDEPENDENT_AMBULATORY_CARE_PROVIDER_SITE_OTHER): Payer: BC Managed Care – PPO

## 2020-04-02 ENCOUNTER — Encounter: Payer: Self-pay | Admitting: Podiatry

## 2020-04-02 ENCOUNTER — Other Ambulatory Visit: Payer: Self-pay

## 2020-04-02 DIAGNOSIS — R609 Edema, unspecified: Secondary | ICD-10-CM | POA: Diagnosis not present

## 2020-04-02 DIAGNOSIS — M2062 Acquired deformities of toe(s), unspecified, left foot: Secondary | ICD-10-CM | POA: Diagnosis not present

## 2020-04-02 DIAGNOSIS — L603 Nail dystrophy: Secondary | ICD-10-CM

## 2020-04-02 DIAGNOSIS — M19079 Primary osteoarthritis, unspecified ankle and foot: Secondary | ICD-10-CM | POA: Diagnosis not present

## 2020-04-02 DIAGNOSIS — M7750 Other enthesopathy of unspecified foot: Secondary | ICD-10-CM

## 2020-04-02 MED ORDER — DICLOFENAC SODIUM 1 % EX GEL: 2.0000 g | Freq: Four times a day (QID) | CUTANEOUS | 2 refills | Status: DC

## 2020-04-02 NOTE — Patient Instructions (Signed)
I have ordered studies to check your veins. If you do not hear for them about scheduling within the next 1 week, or you have any questions please give [place] a call at [phone].

## 2020-04-03 ENCOUNTER — Other Ambulatory Visit: Payer: Self-pay | Admitting: Podiatry

## 2020-04-03 DIAGNOSIS — M7750 Other enthesopathy of unspecified foot: Secondary | ICD-10-CM

## 2020-04-03 DIAGNOSIS — M2062 Acquired deformities of toe(s), unspecified, left foot: Secondary | ICD-10-CM

## 2020-04-08 NOTE — Progress Notes (Signed)
Subjective: 64 year old female presents the office today for concerns of her left big toenail becoming thickened discolored she also has some occasional pain he does on the left arm.  She also has noticed some swelling to her ankles and just discomfort of the right ankle.  She denies any recent injury or trauma Denies any systemic complaints such as fevers, chills, nausea, vomiting. No acute changes since last appointment, and no other complaints at this time.   Objective: AAO x3, NAD DP/PT pulses palpable bilaterally, CRT less than 3 seconds Time deformity present bilaterally.  Tenderness on medial first metatarsal head.  No pain with motion.  Mild tenderness to palpation of the anterior aspect of the right ankle.  There is no erythema or warmth.  No crepitation with ankle range of motion. Edema present bilaterally.  The left hallux toenail is hypertrophic, dystrophic, discolored with brown discoloration.  No redness or drainage or signs of infection. No pain with calf compression, swelling, warmth, erythema  Assessment: 64 year old female left onychodystrophy; bunion; right ankle arthritis; swelling  Plan: -All treatment options discussed with the patient including all alternatives, risks, complications.  -X-rays were obtained and reviewed bilaterally.  Mild arthritic changes present to the right ankle joint.  Significant bunion deformity present bilaterally.  There is no evidence of acute fracture. -Progressive swelling will order venous reflux study -Voltaren gel for the bunion.  Discussed offloading pads, shoe modifications.  Also have any ankle pain discussed for more supportive shoes.  Also will evaluate the swelling to see if this contributed to the ankle pain.  Discussed steroid injection. -Prescribed mobic. Discussed side effects of the medication and directed to stop if any are to occur and call the office.  -Patient encouraged to call the office with any questions, concerns, change in  symptoms.

## 2020-04-09 ENCOUNTER — Telehealth: Payer: Self-pay | Admitting: *Deleted

## 2020-04-09 DIAGNOSIS — R609 Edema, unspecified: Secondary | ICD-10-CM

## 2020-04-09 NOTE — Telephone Encounter (Signed)
Faxed orders to [facility]. 

## 2020-04-09 NOTE — Telephone Encounter (Signed)
-----   Message from Vivi Barrack, DPM sent at 04/08/2020 10:02 AM EDT ----- Can you please order a venous reflux study due to swelling?

## 2020-05-02 DIAGNOSIS — M1711 Unilateral primary osteoarthritis, right knee: Secondary | ICD-10-CM | POA: Diagnosis not present

## 2020-05-04 ENCOUNTER — Other Ambulatory Visit: Payer: Self-pay

## 2020-05-04 ENCOUNTER — Ambulatory Visit (HOSPITAL_COMMUNITY)
Admission: RE | Admit: 2020-05-04 | Discharge: 2020-05-04 | Disposition: A | Discharge status: Home / Self Care | Payer: BC Managed Care – PPO | Source: Ambulatory Visit | Attending: Internal Medicine | Admitting: Internal Medicine

## 2020-05-04 DIAGNOSIS — R609 Edema, unspecified: Secondary | ICD-10-CM | POA: Insufficient documentation

## 2020-05-04 DIAGNOSIS — R600 Localized edema: Secondary | ICD-10-CM

## 2020-05-16 ENCOUNTER — Encounter (INDEPENDENT_AMBULATORY_CARE_PROVIDER_SITE_OTHER): Payer: Self-pay

## 2020-07-09 DIAGNOSIS — Z1231 Encounter for screening mammogram for malignant neoplasm of breast: Secondary | ICD-10-CM | POA: Diagnosis not present

## 2020-07-26 DIAGNOSIS — I10 Essential (primary) hypertension: Secondary | ICD-10-CM | POA: Diagnosis not present

## 2020-07-26 DIAGNOSIS — E039 Hypothyroidism, unspecified: Secondary | ICD-10-CM | POA: Diagnosis not present

## 2020-07-26 DIAGNOSIS — Z23 Encounter for immunization: Secondary | ICD-10-CM | POA: Diagnosis not present

## 2020-07-26 DIAGNOSIS — K219 Gastro-esophageal reflux disease without esophagitis: Secondary | ICD-10-CM | POA: Diagnosis not present

## 2020-08-13 DIAGNOSIS — S96919A Strain of unspecified muscle and tendon at ankle and foot level, unspecified foot, initial encounter: Secondary | ICD-10-CM | POA: Diagnosis not present

## 2020-08-15 DIAGNOSIS — M25572 Pain in left ankle and joints of left foot: Secondary | ICD-10-CM | POA: Diagnosis not present

## 2020-09-08 HISTORY — PX: EYE SURGERY: SHX253

## 2020-09-12 DIAGNOSIS — M25572 Pain in left ankle and joints of left foot: Secondary | ICD-10-CM | POA: Diagnosis not present

## 2020-09-13 ENCOUNTER — Encounter: Payer: BC Managed Care – PPO | Admitting: Sports Medicine

## 2020-09-19 DIAGNOSIS — U071 COVID-19: Secondary | ICD-10-CM | POA: Diagnosis not present

## 2020-09-20 ENCOUNTER — Encounter: Payer: BC Managed Care – PPO | Admitting: Sports Medicine

## 2020-09-27 DIAGNOSIS — U071 COVID-19: Secondary | ICD-10-CM | POA: Diagnosis not present

## 2020-09-27 DIAGNOSIS — R42 Dizziness and giddiness: Secondary | ICD-10-CM | POA: Diagnosis not present

## 2020-09-27 DIAGNOSIS — R059 Cough, unspecified: Secondary | ICD-10-CM | POA: Diagnosis not present

## 2020-09-28 ENCOUNTER — Encounter (INDEPENDENT_AMBULATORY_CARE_PROVIDER_SITE_OTHER): Payer: Self-pay

## 2020-09-28 DIAGNOSIS — U071 COVID-19: Secondary | ICD-10-CM | POA: Diagnosis not present

## 2020-10-15 DIAGNOSIS — Z03818 Encounter for observation for suspected exposure to other biological agents ruled out: Secondary | ICD-10-CM | POA: Diagnosis not present

## 2020-10-15 DIAGNOSIS — Z20822 Contact with and (suspected) exposure to covid-19: Secondary | ICD-10-CM | POA: Diagnosis not present

## 2020-10-17 DIAGNOSIS — M25572 Pain in left ankle and joints of left foot: Secondary | ICD-10-CM | POA: Diagnosis not present

## 2020-10-26 DIAGNOSIS — R262 Difficulty in walking, not elsewhere classified: Secondary | ICD-10-CM | POA: Diagnosis not present

## 2020-10-26 DIAGNOSIS — M76822 Posterior tibial tendinitis, left leg: Secondary | ICD-10-CM | POA: Diagnosis not present

## 2020-10-26 DIAGNOSIS — M76821 Posterior tibial tendinitis, right leg: Secondary | ICD-10-CM | POA: Diagnosis not present

## 2020-10-29 DIAGNOSIS — R262 Difficulty in walking, not elsewhere classified: Secondary | ICD-10-CM | POA: Diagnosis not present

## 2020-10-29 DIAGNOSIS — M76822 Posterior tibial tendinitis, left leg: Secondary | ICD-10-CM | POA: Diagnosis not present

## 2020-10-29 DIAGNOSIS — M76821 Posterior tibial tendinitis, right leg: Secondary | ICD-10-CM | POA: Diagnosis not present

## 2020-10-31 DIAGNOSIS — R262 Difficulty in walking, not elsewhere classified: Secondary | ICD-10-CM | POA: Diagnosis not present

## 2020-10-31 DIAGNOSIS — M76821 Posterior tibial tendinitis, right leg: Secondary | ICD-10-CM | POA: Diagnosis not present

## 2020-10-31 DIAGNOSIS — M76822 Posterior tibial tendinitis, left leg: Secondary | ICD-10-CM | POA: Diagnosis not present

## 2020-11-05 DIAGNOSIS — M76822 Posterior tibial tendinitis, left leg: Secondary | ICD-10-CM | POA: Diagnosis not present

## 2020-11-05 DIAGNOSIS — M76821 Posterior tibial tendinitis, right leg: Secondary | ICD-10-CM | POA: Diagnosis not present

## 2020-11-05 DIAGNOSIS — R262 Difficulty in walking, not elsewhere classified: Secondary | ICD-10-CM | POA: Diagnosis not present

## 2020-11-09 DIAGNOSIS — M76821 Posterior tibial tendinitis, right leg: Secondary | ICD-10-CM | POA: Diagnosis not present

## 2020-11-09 DIAGNOSIS — M76822 Posterior tibial tendinitis, left leg: Secondary | ICD-10-CM | POA: Diagnosis not present

## 2020-11-09 DIAGNOSIS — R262 Difficulty in walking, not elsewhere classified: Secondary | ICD-10-CM | POA: Diagnosis not present

## 2020-11-12 DIAGNOSIS — R262 Difficulty in walking, not elsewhere classified: Secondary | ICD-10-CM | POA: Diagnosis not present

## 2020-11-12 DIAGNOSIS — M76822 Posterior tibial tendinitis, left leg: Secondary | ICD-10-CM | POA: Diagnosis not present

## 2020-11-12 DIAGNOSIS — M76821 Posterior tibial tendinitis, right leg: Secondary | ICD-10-CM | POA: Diagnosis not present

## 2020-11-15 DIAGNOSIS — R262 Difficulty in walking, not elsewhere classified: Secondary | ICD-10-CM | POA: Diagnosis not present

## 2020-11-15 DIAGNOSIS — M76821 Posterior tibial tendinitis, right leg: Secondary | ICD-10-CM | POA: Diagnosis not present

## 2020-11-15 DIAGNOSIS — M76822 Posterior tibial tendinitis, left leg: Secondary | ICD-10-CM | POA: Diagnosis not present

## 2020-11-19 DIAGNOSIS — R262 Difficulty in walking, not elsewhere classified: Secondary | ICD-10-CM | POA: Diagnosis not present

## 2020-11-19 DIAGNOSIS — M76821 Posterior tibial tendinitis, right leg: Secondary | ICD-10-CM | POA: Diagnosis not present

## 2020-11-19 DIAGNOSIS — M76822 Posterior tibial tendinitis, left leg: Secondary | ICD-10-CM | POA: Diagnosis not present

## 2020-11-22 DIAGNOSIS — M76822 Posterior tibial tendinitis, left leg: Secondary | ICD-10-CM | POA: Diagnosis not present

## 2020-11-22 DIAGNOSIS — M76821 Posterior tibial tendinitis, right leg: Secondary | ICD-10-CM | POA: Diagnosis not present

## 2020-11-22 DIAGNOSIS — R262 Difficulty in walking, not elsewhere classified: Secondary | ICD-10-CM | POA: Diagnosis not present

## 2020-11-26 DIAGNOSIS — M76822 Posterior tibial tendinitis, left leg: Secondary | ICD-10-CM | POA: Diagnosis not present

## 2020-11-26 DIAGNOSIS — M76821 Posterior tibial tendinitis, right leg: Secondary | ICD-10-CM | POA: Diagnosis not present

## 2020-11-26 DIAGNOSIS — R262 Difficulty in walking, not elsewhere classified: Secondary | ICD-10-CM | POA: Diagnosis not present

## 2020-12-12 DIAGNOSIS — M76821 Posterior tibial tendinitis, right leg: Secondary | ICD-10-CM | POA: Diagnosis not present

## 2020-12-19 DIAGNOSIS — M1711 Unilateral primary osteoarthritis, right knee: Secondary | ICD-10-CM | POA: Diagnosis not present

## 2021-01-25 DIAGNOSIS — I10 Essential (primary) hypertension: Secondary | ICD-10-CM | POA: Diagnosis not present

## 2021-01-25 DIAGNOSIS — K219 Gastro-esophageal reflux disease without esophagitis: Secondary | ICD-10-CM | POA: Diagnosis not present

## 2021-01-25 DIAGNOSIS — Z23 Encounter for immunization: Secondary | ICD-10-CM | POA: Diagnosis not present

## 2021-01-25 DIAGNOSIS — E7800 Pure hypercholesterolemia, unspecified: Secondary | ICD-10-CM | POA: Diagnosis not present

## 2021-01-25 DIAGNOSIS — G629 Polyneuropathy, unspecified: Secondary | ICD-10-CM | POA: Diagnosis not present

## 2021-01-25 DIAGNOSIS — E039 Hypothyroidism, unspecified: Secondary | ICD-10-CM | POA: Diagnosis not present

## 2021-01-29 ENCOUNTER — Telehealth: Payer: Self-pay | Admitting: *Deleted

## 2021-01-29 ENCOUNTER — Ambulatory Visit (INDEPENDENT_AMBULATORY_CARE_PROVIDER_SITE_OTHER): Payer: BC Managed Care – PPO

## 2021-01-29 ENCOUNTER — Other Ambulatory Visit: Payer: Self-pay

## 2021-01-29 ENCOUNTER — Ambulatory Visit: Payer: BC Managed Care – PPO | Admitting: Podiatry

## 2021-01-29 DIAGNOSIS — B351 Tinea unguium: Secondary | ICD-10-CM | POA: Diagnosis not present

## 2021-01-29 DIAGNOSIS — Z79899 Other long term (current) drug therapy: Secondary | ICD-10-CM | POA: Diagnosis not present

## 2021-01-29 DIAGNOSIS — S96912A Strain of unspecified muscle and tendon at ankle and foot level, left foot, initial encounter: Secondary | ICD-10-CM

## 2021-01-29 DIAGNOSIS — R609 Edema, unspecified: Secondary | ICD-10-CM

## 2021-01-29 DIAGNOSIS — M76822 Posterior tibial tendinitis, left leg: Secondary | ICD-10-CM

## 2021-01-29 NOTE — Telephone Encounter (Signed)
Patient is calling to let Dr Ardelle Anton know that she had spoken with her pcp and they told her that her liver is fine from the labs. May be reached at : [phone].

## 2021-01-30 NOTE — Telephone Encounter (Signed)
Ammie- can you call her PCP office and have them fax over the recent blood work? I would like to see a CBC and liver function test. Thanks!

## 2021-01-31 NOTE — Telephone Encounter (Signed)
Received lab work from [facility] as requested by Dr Ardelle Anton.01/31/21

## 2021-02-08 ENCOUNTER — Telehealth: Payer: Self-pay | Admitting: *Deleted

## 2021-02-08 MED ORDER — TERBINAFINE HCL 250 MG PO TABS: 250.0000 mg | ORAL_TABLET | Freq: Every day | ORAL | 0 refills | Status: DC

## 2021-02-08 NOTE — Telephone Encounter (Signed)
Called and left a message for the patient stating that Dr Ardelle Anton wanted me to call and see how the patient was doing with the tri-lock and to let patient know that we have ordered the MRI and to call me on Monday if any concerns or questions. Misty Stanley

## 2021-02-11 NOTE — Progress Notes (Signed)
Subjective:   Patient ID: Jocelyn White, female   DOB: 65 y.o.   MRN: [id number]   HPI 65 year old female presents the office today for concerns of toenail issues as well as for her arches "falling".  Regards the toenails not causing significant discomfort.  Denies any redness or drainage.  They are discolored.  She has been getting discomfort to her left ankle most on the medial aspect but she does not feel quite swelling she has at the arches fall on more.  No recent injury.  This has been getting worse over last several months.  No other concerns.   Review of Systems  All other systems reviewed and are negative.  Past Medical History:  Diagnosis Date  . DVT (deep venous thrombosis) (HCC)   . GERD (gastroesophageal reflux disease)   . Hypertension   . Obesity   . Thyroid disease     No past surgical history on file.   Current Outpatient Medications:  .  terbinafine (LAMISIL) 250 MG tablet, Take 1 tablet (250 mg total) by mouth daily., Disp: 90 tablet, Rfl: 0 .  diclofenac Sodium (VOLTAREN) 1 % GEL, Apply 2 g topically 4 (four) times daily. Rub into affected area of foot 2 to 4 times daily, Disp: 100 g, Rfl: 2 .  levothyroxine (SYNTHROID, LEVOTHROID) 175 MCG tablet, Take 175 mcg by mouth daily., Disp: , Rfl: 2 .  LOSARTAN POTASSIUM PO, Take by mouth., Disp: , Rfl:  .  meclizine (ANTIVERT) 25 MG tablet, Take 1 tablet (25 mg total) by mouth 3 (three) times daily as needed for dizziness., Disp: 20 tablet, Rfl: 0 .  meloxicam (MOBIC) 7.5 MG tablet, Take 1 tablet (7.5 mg total) by mouth daily., Disp: 14 tablet, Rfl: 0 .  omeprazole (PRILOSEC) 40 MG capsule, Take 40 mg by mouth daily., Disp: , Rfl:   Allergies  Allergen Reactions  . Lisinopril     Other reaction(s): cough  . Nabumetone     Other reaction(s): dizziness        Objective:  Physical Exam  General: AAO x3, NAD  Dermatological: Nails are hypertrophic, dystrophic with yellow discoloration.  No pain the nails  there is no redness or drainage or signs of infection.  There is no open lesions.  Vascular: Dorsalis Pedis artery and Posterior Tibial artery pedal pulses are 2/4 bilateral with immedate capillary fill time. There is no pain with calf compression, swelling, warmth, erythema.   Neruologic: Grossly intact via light touch bilateral.   Musculoskeletal: Decreased medial arch height upon weightbearing.  Tenderness palpation across the posterior tibial tendon with localized edema.  She is not able to do a single heel rise on the left side but she is able to do it on the right.  No area of pinpoint tenderness identified today.  Muscular strength 5/5 in all groups tested bilateral.  Gait: Unassisted, Nonantalgic.       Assessment:   Onychomycosis, posterior tibial tendon dysfunction    Plan:  -Treatment options discussed including all alternatives, risks, and complications -Etiology of symptoms were discussed -X-rays were obtained and reviewed with the patient.  There is no acute fracture or stress fracture identified today. -He had ankle pain as well as her symptoms recommend MRI to rule out partial tearing the posterior tibial tendon.  Tri-Lock ankle brace was dispensed today.  Discussed shoes and good arch supports. -Regards to nails discussed treatment options both oral, topical medications discussed.  Also do Lamisil.  Once we get the results of  her last blood work we will order the Lamisil. Discussed side effects.  Of the previous culture did not show fungus clinically appears to be fungus and she understands this and wants to start medication for fungus.  Vivi Barrack DPM

## 2021-02-13 ENCOUNTER — Telehealth: Payer: Self-pay | Admitting: Podiatry

## 2021-02-13 NOTE — Telephone Encounter (Signed)
Pt called back and would like for you to give her a call back any time after 4 PM. Please advise.

## 2021-02-13 NOTE — Telephone Encounter (Signed)
Patient calling to request a call back.

## 2021-02-14 ENCOUNTER — Telehealth: Payer: Self-pay | Admitting: *Deleted

## 2021-02-14 NOTE — Telephone Encounter (Signed)
Called and spoke with the patient today and relayed the message about the MRI and patient stated that she appreciated that I called her back and that she was scheduled for the MRI tomorrow morning and I stated that the results could take up to 24 to 48 hours and to call the office if any concerns or questions. Misty Stanley

## 2021-02-17 ENCOUNTER — Ambulatory Visit
Admission: RE | Admit: 2021-02-17 | Discharge: 2021-02-17 | Disposition: A | Discharge status: Home / Self Care | Payer: BC Managed Care – PPO | Source: Ambulatory Visit | Attending: Podiatry | Admitting: Podiatry

## 2021-02-17 DIAGNOSIS — M7989 Other specified soft tissue disorders: Secondary | ICD-10-CM | POA: Diagnosis not present

## 2021-02-17 DIAGNOSIS — S86812A Strain of other muscle(s) and tendon(s) at lower leg level, left leg, initial encounter: Secondary | ICD-10-CM | POA: Diagnosis not present

## 2021-02-17 DIAGNOSIS — M76822 Posterior tibial tendinitis, left leg: Secondary | ICD-10-CM | POA: Diagnosis not present

## 2021-02-17 DIAGNOSIS — S96912A Strain of unspecified muscle and tendon at ankle and foot level, left foot, initial encounter: Secondary | ICD-10-CM

## 2021-03-15 ENCOUNTER — Ambulatory Visit: Payer: BC Managed Care – PPO | Admitting: Podiatry

## 2021-03-19 ENCOUNTER — Ambulatory Visit: Payer: BC Managed Care – PPO | Admitting: Podiatry

## 2021-03-19 ENCOUNTER — Encounter: Payer: Self-pay | Admitting: Podiatry

## 2021-03-19 ENCOUNTER — Other Ambulatory Visit: Payer: Self-pay

## 2021-03-19 DIAGNOSIS — Z79899 Other long term (current) drug therapy: Secondary | ICD-10-CM | POA: Diagnosis not present

## 2021-03-19 DIAGNOSIS — S96912D Strain of unspecified muscle and tendon at ankle and foot level, left foot, subsequent encounter: Secondary | ICD-10-CM | POA: Diagnosis not present

## 2021-03-19 DIAGNOSIS — M76822 Posterior tibial tendinitis, left leg: Secondary | ICD-10-CM

## 2021-03-20 LAB — CBC WITH DIFFERENTIAL/PLATELET
Basophils Absolute: 0 10*3/uL (ref 0.0–0.2)
Basos: 0 %
EOS (ABSOLUTE): 0.1 10*3/uL (ref 0.0–0.4)
Eos: 1 %
Hematocrit: 41.8 % (ref 34.0–46.6)
Hemoglobin: 12.7 g/dL (ref 11.1–15.9)
Immature Grans (Abs): 0 10*3/uL (ref 0.0–0.1)
Immature Granulocytes: 0 %
Lymphocytes Absolute: 3.3 10*3/uL — ABNORMAL HIGH (ref 0.7–3.1)
Lymphs: 33 %
MCH: 21.7 pg — ABNORMAL LOW (ref 26.6–33.0)
MCHC: 30.4 g/dL — ABNORMAL LOW (ref 31.5–35.7)
MCV: 72 fL — ABNORMAL LOW (ref 79–97)
Monocytes Absolute: 0.6 10*3/uL (ref 0.1–0.9)
Monocytes: 6 %
Neutrophils Absolute: 5.8 10*3/uL (ref 1.4–7.0)
Neutrophils: 60 %
Platelets: 155 10*3/uL (ref 150–450)
RBC: 5.85 x10E6/uL — ABNORMAL HIGH (ref 3.77–5.28)
RDW: 15.5 % — ABNORMAL HIGH (ref 11.7–15.4)
WBC: 9.9 10*3/uL (ref 3.4–10.8)

## 2021-03-20 LAB — HEPATIC FUNCTION PANEL
ALT: 14 IU/L (ref 0–32)
AST: 16 IU/L (ref 0–40)
Albumin: 4.2 g/dL (ref 3.8–4.8)
Alkaline Phosphatase: 89 IU/L (ref 44–121)
Bilirubin Total: <0.2 mg/dL (ref 0.0–1.2)
Bilirubin, Direct: <0.1 mg/dL (ref 0.00–0.40)
Total Protein: 7 g/dL (ref 6.0–8.5)

## 2021-03-25 NOTE — Progress Notes (Signed)
Subjective: 65 year old female presents the office for follow-up evaluation of left ankle discomfort discussed MRI results as well as for nail fungus.  She is still having tenderness along the medial aspect ankle on the tendon.  She still gets some swelling as well.  She states has been on Lamisil and side effects.  Nails somewhat improved.  No pain in the nail or any redness or drainage. Denies any systemic complaints such as fevers, chills, nausea, vomiting. No acute changes since last appointment, and no other complaints at this time.   Objective: AAO x3, NAD DP/PT pulses palpable bilaterally, CRT less than 3 seconds There is continuation of tenderness palpation along the course of the posterior tibial tendon there is localized edema to the area.  Decreased medial arch upon weightbearing.  Ankle joint range of motion intact but any restrictions.  No pain with Achilles tendon. The toenails are doing better in some clinical proximal nail fold.  No pain in the nails and denies any redness or drainage or any swelling. No pain with calf compression, swelling, warmth, erythema  Assessment: 65 year old female with posterior tibial tendon dysfunction, partial tearing; onychomycosis  Plan: -All treatment options discussed with the patient including all alternatives, risks, complications.  -I reviewed the MRI with her.  Did show split to the posterior tibial tendon.  Given the continuation of tenderness as well as swelling would recommend immobilization in a cam boot which was dispensed today.  After period of immobilization then we will start likely with physical therapy and work on shoes and orthotics. -Recheck CBC and LFT for Lamisil. -Patient encouraged to call the office with any questions, concerns, change in symptoms.   Vivi Barrack DPM

## 2021-04-16 ENCOUNTER — Encounter: Payer: Self-pay | Admitting: Podiatry

## 2021-04-16 ENCOUNTER — Other Ambulatory Visit: Payer: Self-pay

## 2021-04-16 ENCOUNTER — Ambulatory Visit: Payer: BC Managed Care – PPO | Admitting: Podiatry

## 2021-04-16 DIAGNOSIS — K219 Gastro-esophageal reflux disease without esophagitis: Secondary | ICD-10-CM | POA: Insufficient documentation

## 2021-04-16 DIAGNOSIS — M76822 Posterior tibial tendinitis, left leg: Secondary | ICD-10-CM

## 2021-04-16 DIAGNOSIS — R635 Abnormal weight gain: Secondary | ICD-10-CM | POA: Insufficient documentation

## 2021-04-16 DIAGNOSIS — B351 Tinea unguium: Secondary | ICD-10-CM | POA: Diagnosis not present

## 2021-04-16 DIAGNOSIS — S96912D Strain of unspecified muscle and tendon at ankle and foot level, left foot, subsequent encounter: Secondary | ICD-10-CM

## 2021-04-16 DIAGNOSIS — Z1211 Encounter for screening for malignant neoplasm of colon: Secondary | ICD-10-CM | POA: Insufficient documentation

## 2021-04-16 DIAGNOSIS — E039 Hypothyroidism, unspecified: Secondary | ICD-10-CM | POA: Diagnosis not present

## 2021-04-16 MED ORDER — METHYLPREDNISOLONE 4 MG PO TBPK: ORAL_TABLET | ORAL | 0 refills | Status: DC

## 2021-04-19 NOTE — Progress Notes (Signed)
Subjective: 65 year old female presents the office for follow-up evaluation of left ankle discomfort as well as for nail fungus, currently on Lamisil.  She did bring up today that prior to the start of the pain she did fall about 2 months ago and she was asked if that could have caused this issue to her ankle.  She states that the ankle feels, she wears a boot not see much swelling but she can still feel on the instep area.  When she does not have the boot or support the symptoms to come back.  Denied any side effects of Lamisil.  Denies any systemic complaints such as fevers, chills, nausea, vomiting. No acute changes since last appointment, and no other complaints at this time.   Objective: AAO x3, NAD DP/PT pulses palpable bilaterally, CRT less than 3 seconds There is decreased but continued tenderness along palpation along the course of the posterior tibial tendon there is localized edema to the area although the edema is mild.  Decreased medial arch upon weightbearing.  Ankle joint range of motion intact but any restrictions.  No pain with Achilles tendon. The toenails are doing better in some improvement along the proximal nail fold and nails appear growing out.  No pain in the nails and denies any redness or drainage or any swelling. No pain with calf compression, swelling, warmth, erythema  Assessment: 65 year old female with posterior tibial tendon dysfunction, partial tearing; onychomycosis  Plan: -All treatment options discussed with the patient including all alternatives, risks, complications.  -At this point organ to start physical therapy.  Referral for Eliquis physical therapy placed.  Continue to for now he is on her feet more and as she starts physical therapy and she improves she can transition to regular shoe with a good arch support as able. -Finished Lamisil.  I will see her back in 4 weeks to check on both issues if needed we may consider extending this versus changing to  topical medication -Patient encouraged to call the office with any questions, concerns, change in symptoms.   Return in about 4 weeks (around 05/14/2021).  Vivi Barrack DPM

## 2021-05-06 DIAGNOSIS — M25572 Pain in left ankle and joints of left foot: Secondary | ICD-10-CM | POA: Diagnosis not present

## 2021-05-06 DIAGNOSIS — M76822 Posterior tibial tendinitis, left leg: Secondary | ICD-10-CM | POA: Diagnosis not present

## 2021-05-06 DIAGNOSIS — R262 Difficulty in walking, not elsewhere classified: Secondary | ICD-10-CM | POA: Diagnosis not present

## 2021-05-06 DIAGNOSIS — R600 Localized edema: Secondary | ICD-10-CM | POA: Diagnosis not present

## 2021-05-08 DIAGNOSIS — M76822 Posterior tibial tendinitis, left leg: Secondary | ICD-10-CM | POA: Diagnosis not present

## 2021-05-08 DIAGNOSIS — R262 Difficulty in walking, not elsewhere classified: Secondary | ICD-10-CM | POA: Diagnosis not present

## 2021-05-08 DIAGNOSIS — M25572 Pain in left ankle and joints of left foot: Secondary | ICD-10-CM | POA: Diagnosis not present

## 2021-05-08 DIAGNOSIS — R600 Localized edema: Secondary | ICD-10-CM | POA: Diagnosis not present

## 2021-05-16 ENCOUNTER — Other Ambulatory Visit: Payer: Self-pay

## 2021-05-16 ENCOUNTER — Ambulatory Visit: Payer: BC Managed Care – PPO | Admitting: Podiatry

## 2021-05-16 ENCOUNTER — Encounter: Payer: Self-pay | Admitting: Podiatry

## 2021-05-16 DIAGNOSIS — B351 Tinea unguium: Secondary | ICD-10-CM

## 2021-05-16 DIAGNOSIS — S96912D Strain of unspecified muscle and tendon at ankle and foot level, left foot, subsequent encounter: Secondary | ICD-10-CM | POA: Diagnosis not present

## 2021-05-16 DIAGNOSIS — M76822 Posterior tibial tendinitis, left leg: Secondary | ICD-10-CM

## 2021-05-16 DIAGNOSIS — M25572 Pain in left ankle and joints of left foot: Secondary | ICD-10-CM | POA: Diagnosis not present

## 2021-05-16 DIAGNOSIS — R262 Difficulty in walking, not elsewhere classified: Secondary | ICD-10-CM | POA: Diagnosis not present

## 2021-05-16 DIAGNOSIS — R600 Localized edema: Secondary | ICD-10-CM | POA: Diagnosis not present

## 2021-05-16 MED ORDER — NITROGLYCERIN 0.2 MG/HR TD PT24: MEDICATED_PATCH | TRANSDERMAL | 12 refills | Status: DC

## 2021-05-17 ENCOUNTER — Telehealth: Payer: Self-pay

## 2021-05-17 DIAGNOSIS — R262 Difficulty in walking, not elsewhere classified: Secondary | ICD-10-CM | POA: Diagnosis not present

## 2021-05-17 DIAGNOSIS — M76822 Posterior tibial tendinitis, left leg: Secondary | ICD-10-CM | POA: Diagnosis not present

## 2021-05-17 DIAGNOSIS — R600 Localized edema: Secondary | ICD-10-CM | POA: Diagnosis not present

## 2021-05-17 DIAGNOSIS — M25572 Pain in left ankle and joints of left foot: Secondary | ICD-10-CM | POA: Diagnosis not present

## 2021-05-17 NOTE — Telephone Encounter (Signed)
Pt called and would like to ask for a new detailed letter for jury duty. She stated that the letter she was presented yesterday was what they consider a simple letter.

## 2021-05-20 ENCOUNTER — Encounter: Payer: Self-pay | Admitting: Podiatry

## 2021-05-20 DIAGNOSIS — M25572 Pain in left ankle and joints of left foot: Secondary | ICD-10-CM | POA: Diagnosis not present

## 2021-05-20 DIAGNOSIS — R600 Localized edema: Secondary | ICD-10-CM | POA: Diagnosis not present

## 2021-05-20 DIAGNOSIS — M76822 Posterior tibial tendinitis, left leg: Secondary | ICD-10-CM | POA: Diagnosis not present

## 2021-05-20 DIAGNOSIS — R262 Difficulty in walking, not elsewhere classified: Secondary | ICD-10-CM | POA: Diagnosis not present

## 2021-05-20 NOTE — Telephone Encounter (Signed)
LVM letting pt know that her note has been sent to MyChart to review.

## 2021-05-22 NOTE — Progress Notes (Signed)
Subjective: 65 year old female presents the office for follow-up evaluation of left ankle discomfort, split tear posterior tibial tendon and spring ligament partial tear.  This started after she had a fall a couple months ago.  She is doing physical therapy once since I last saw her.  She has not had any recent injury or trauma since I last saw her.  No side effects of the Lamisil.  Objective: AAO x3, NAD DP/PT pulses palpable bilaterally, CRT less than 3 seconds Overall exam is unchanged.  There is continued tenderness along palpation along the course of the posterior tibial tendon there is localized edema to the area although the edema is mild.  Decreased medial arch upon weightbearing.  Ankle joint range of motion intact but any restrictions.  No pain with Achilles tendon. Clearing along the proximal nail folds.  No edema, erythema to the toenail sites. No pain with calf compression, swelling, warmth, erythema  Assessment: 65 year old female with posterior tibial tendon dysfunction, partial tearing; onychomycosis  Plan: -All treatment options discussed with the patient including all alternatives, risks, complications.  -We discussed with conservative as well as surgical treatment options given the clinical discomfort.  She is only physical therapy for 1 session.  She is still in the cam boot.  At this point I recommend continue with physical therapy for a couple of weeks and hopefully she will start to make better progress.  As she starts physical therapy she can transition out of the boot as able.  Continue ice and elevate. -Monitor the nails after growing out.  Denies any side effects from Lamisil.  Vivi Barrack DPM

## 2021-05-23 DIAGNOSIS — R262 Difficulty in walking, not elsewhere classified: Secondary | ICD-10-CM | POA: Diagnosis not present

## 2021-05-23 DIAGNOSIS — M25572 Pain in left ankle and joints of left foot: Secondary | ICD-10-CM | POA: Diagnosis not present

## 2021-05-23 DIAGNOSIS — M76822 Posterior tibial tendinitis, left leg: Secondary | ICD-10-CM | POA: Diagnosis not present

## 2021-05-23 DIAGNOSIS — R600 Localized edema: Secondary | ICD-10-CM | POA: Diagnosis not present

## 2021-05-28 DIAGNOSIS — R262 Difficulty in walking, not elsewhere classified: Secondary | ICD-10-CM | POA: Diagnosis not present

## 2021-05-28 DIAGNOSIS — R600 Localized edema: Secondary | ICD-10-CM | POA: Diagnosis not present

## 2021-05-28 DIAGNOSIS — M25572 Pain in left ankle and joints of left foot: Secondary | ICD-10-CM | POA: Diagnosis not present

## 2021-05-28 DIAGNOSIS — M76822 Posterior tibial tendinitis, left leg: Secondary | ICD-10-CM | POA: Diagnosis not present

## 2021-05-30 DIAGNOSIS — R600 Localized edema: Secondary | ICD-10-CM | POA: Diagnosis not present

## 2021-05-30 DIAGNOSIS — M76822 Posterior tibial tendinitis, left leg: Secondary | ICD-10-CM | POA: Diagnosis not present

## 2021-05-30 DIAGNOSIS — M25572 Pain in left ankle and joints of left foot: Secondary | ICD-10-CM | POA: Diagnosis not present

## 2021-05-30 DIAGNOSIS — R262 Difficulty in walking, not elsewhere classified: Secondary | ICD-10-CM | POA: Diagnosis not present

## 2021-06-03 DIAGNOSIS — M25572 Pain in left ankle and joints of left foot: Secondary | ICD-10-CM | POA: Diagnosis not present

## 2021-06-03 DIAGNOSIS — R262 Difficulty in walking, not elsewhere classified: Secondary | ICD-10-CM | POA: Diagnosis not present

## 2021-06-03 DIAGNOSIS — M76822 Posterior tibial tendinitis, left leg: Secondary | ICD-10-CM | POA: Diagnosis not present

## 2021-06-03 DIAGNOSIS — R600 Localized edema: Secondary | ICD-10-CM | POA: Diagnosis not present

## 2021-06-05 DIAGNOSIS — M25572 Pain in left ankle and joints of left foot: Secondary | ICD-10-CM | POA: Diagnosis not present

## 2021-06-05 DIAGNOSIS — R600 Localized edema: Secondary | ICD-10-CM | POA: Diagnosis not present

## 2021-06-05 DIAGNOSIS — M76822 Posterior tibial tendinitis, left leg: Secondary | ICD-10-CM | POA: Diagnosis not present

## 2021-06-05 DIAGNOSIS — R262 Difficulty in walking, not elsewhere classified: Secondary | ICD-10-CM | POA: Diagnosis not present

## 2021-06-13 ENCOUNTER — Other Ambulatory Visit: Payer: Self-pay

## 2021-06-13 ENCOUNTER — Ambulatory Visit: Payer: BC Managed Care – PPO | Admitting: Podiatry

## 2021-06-13 DIAGNOSIS — B351 Tinea unguium: Secondary | ICD-10-CM | POA: Diagnosis not present

## 2021-06-13 DIAGNOSIS — M76822 Posterior tibial tendinitis, left leg: Secondary | ICD-10-CM

## 2021-06-13 DIAGNOSIS — M7752 Other enthesopathy of left foot: Secondary | ICD-10-CM | POA: Diagnosis not present

## 2021-06-13 MED ORDER — CICLOPIROX 8 % EX SOLN: Freq: Every day | CUTANEOUS | 0 refills | Status: DC

## 2021-06-13 MED ORDER — MELOXICAM 15 MG PO TABS: 15.0000 mg | ORAL_TABLET | Freq: Every day | ORAL | 2 refills | Status: AC

## 2021-06-13 NOTE — Patient Instructions (Signed)

## 2021-06-18 NOTE — Progress Notes (Signed)
Subjective: 65 year old female presents the office for follow-up evaluation of left ankle discomfort, split tear posterior tibial tendon and spring ligament partial tear.  States that she is doing her in the boot still having discomfort.  She said the x-ray showed she had pain the majority the day.  She points on the medial aspect ankle but also the anterior aspect ankle where she has discomfort.   Also concerned about toenail fungus still.  She has tried oral medication with some improvement.    Objective: AAO x3, NAD DP, PT pulses palpable bilaterally 2/4.   There is continued tenderness along palpation along the course of the posterior tibial tendon there is localized edema to the area although the edema is mild.  Tenderness on the anteromedial aspect ankle joint as well.  Decreased medial arch upon weightbearing.  Ankle joint range of motion intact but any restrictions.  No pain with Achilles tendon. Clearing along the proximal nail folds.  No edema, erythema to the toenail sites.  No significant changes to the nail immediately also remains hypertrophic, dystrophic with yellow-brown discoloration.  No hyperpigmented changes. No pain with calf compression, swelling, warmth, erythema  Assessment: 65 year old female with posterior tibial tendon dysfunction, partial tearing; capsulitis ankle; onychomycosis  Plan: -All treatment options discussed with the patient including all alternatives, risks, complications.  -Today steroid injections formed to the anterior medial aspect ankle joint on the left side.  The skin was prepped with Betadine, alcohol and mixture 1 cc Kenalog 10, 1 cc lidocaine plain was infiltrated to the anterior medial aspect ankle joint no complications.  Postinjection care discussed.  She tolerated procedure well. -She has been in the boot now for quite some time.  I discussed with her try to transition to a regular shoe with an ankle brace.  I continue stretching, rehab.  We  again discussed other treatment options.  Discussed with her possible nonsurgical options including EPAT or may need to further consider surgical intervention given the ongoing discomfort.  She is wanting to hold off on surgery for now. -Penlac for nail fungus ordered  Jocelyn White DPM

## 2021-07-09 IMAGING — MR MR ANKLE*L* W/O CM
4 of 5 series · 15 of 40 positions shown · non-contrast
Comparison: Radiograph 01/29/2021

CLINICAL DATA: Ankle pain, tendon abnormality suspected, neg xray

EXAM:
MRI OF THE LEFT ANKLE WITHOUT CONTRAST
TECHNIQUE: Multiplanar, multisequence MR imaging of the ankle was performed. No
intravenous contrast was administered.

[Series 3: PD fat-sat · axial · left · 3.5mm · 0.25mm/px · z∈[-84,+27]mm · 6 of 30 slices shown]
[im 1/30]
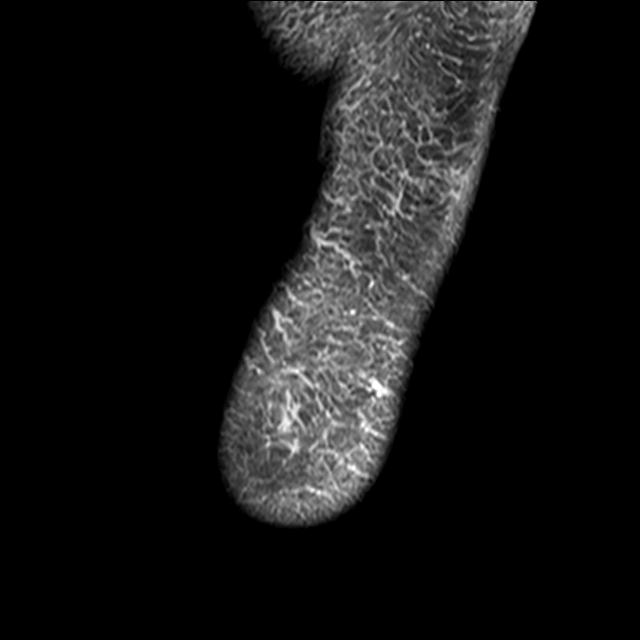
[im 5/30]
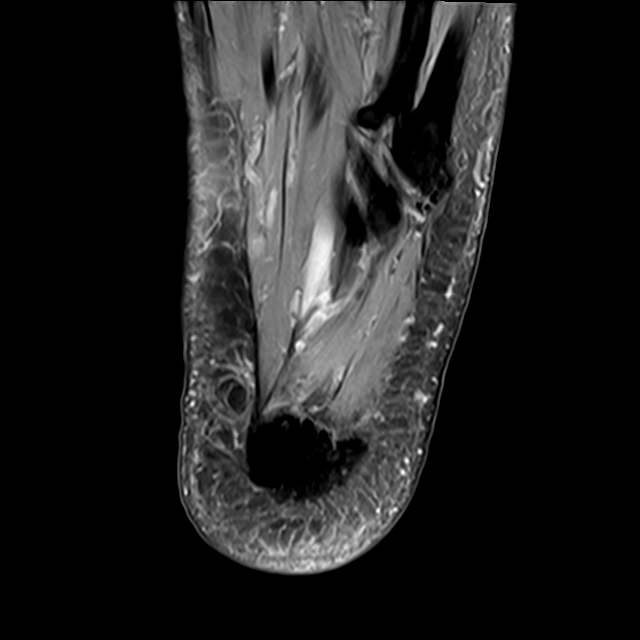
[im 9/30]
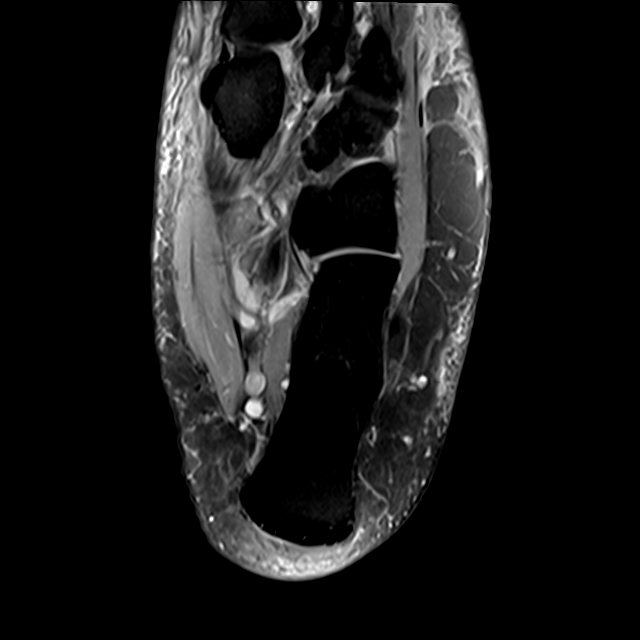
[im 13/30]
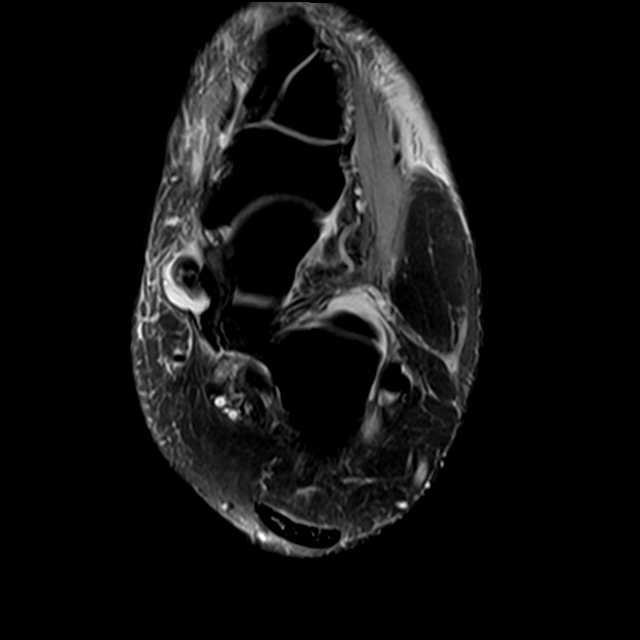
[im 17/30]
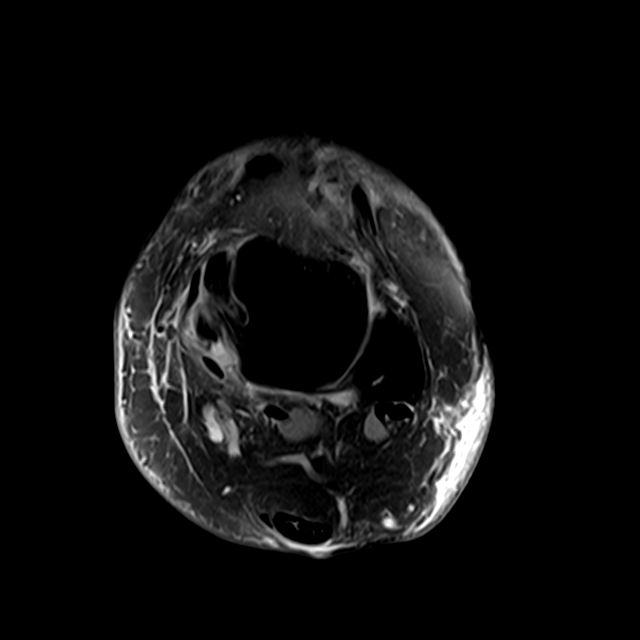
[im 25/30]
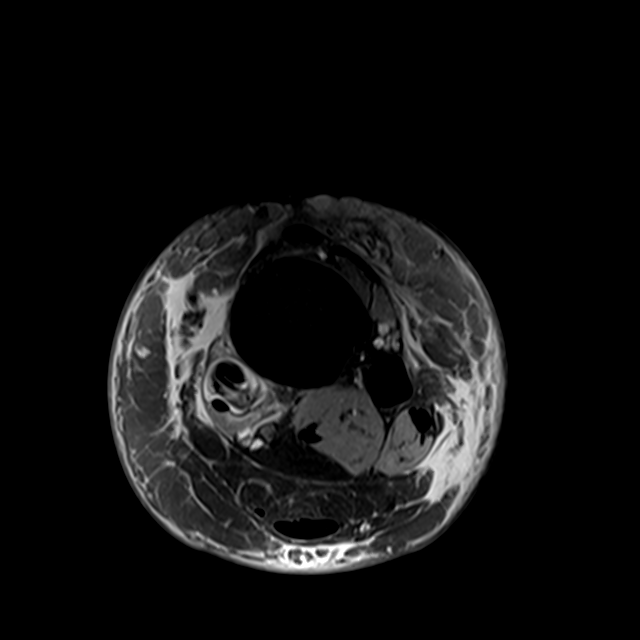

[Series 4: T2 fat-sat · axial · left · 3.5mm · 0.25mm/px · z∈[-70,+32]mm · 3 of 30 slices shown (1 of 2)]
[im 4/30]
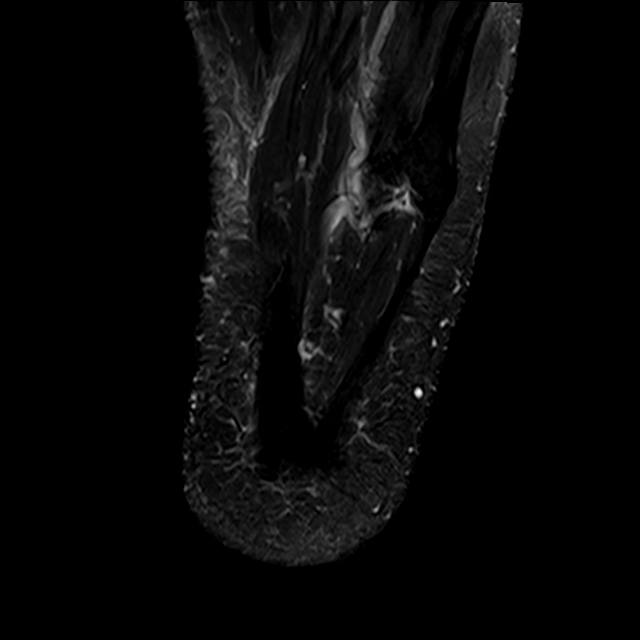
[im 15/30]
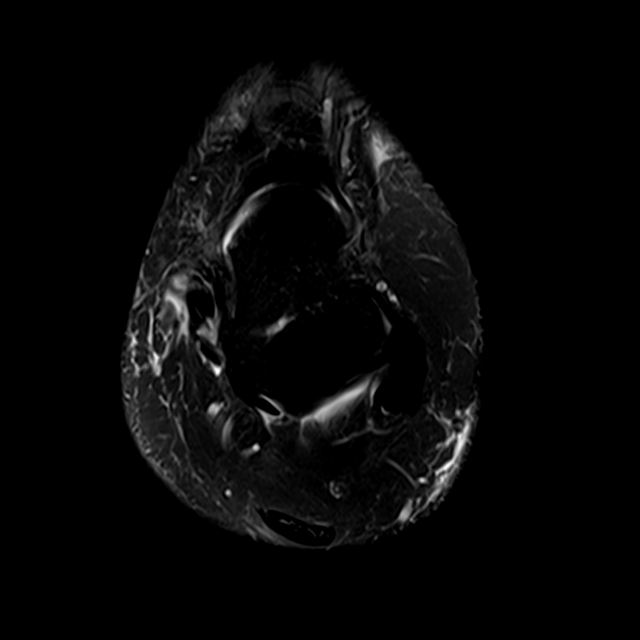
[im 26/30]
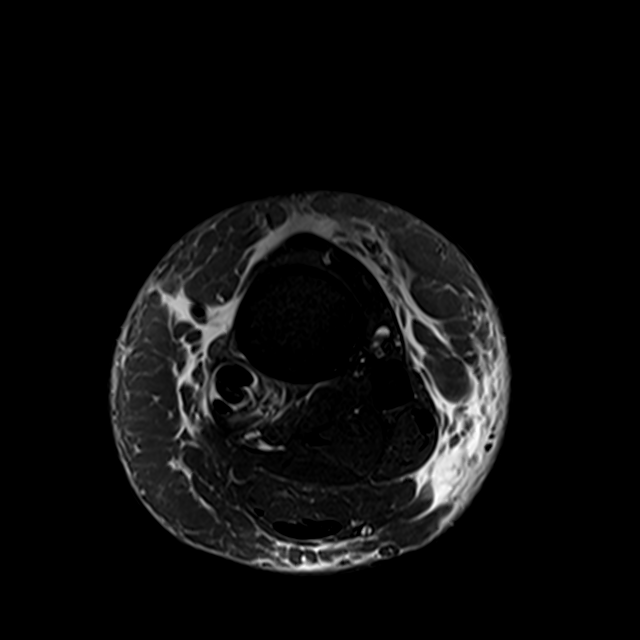

[Series 5: T1 · sagittal · left · 4.0mm · 0.53mm/px · 3 of 24 slices shown]
[im 4/24]
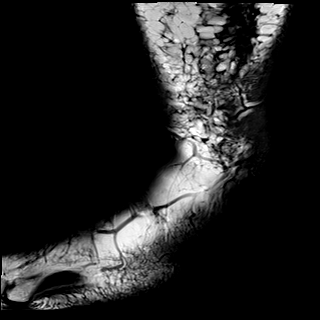
[im 12/24]
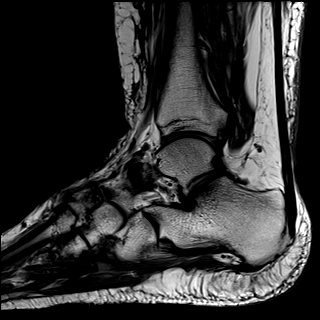
[im 20/24]
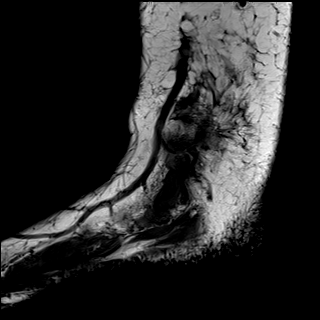

[Series 7: T2 fat-sat · coronal · left · 3.0mm · 0.25mm/px · 3 of 33 slices shown (2 of 2)]
[im 5/33]
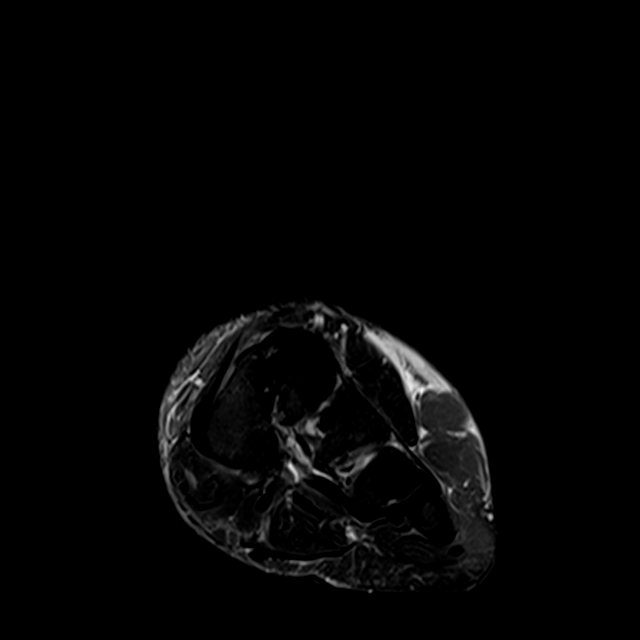
[im 17/33]
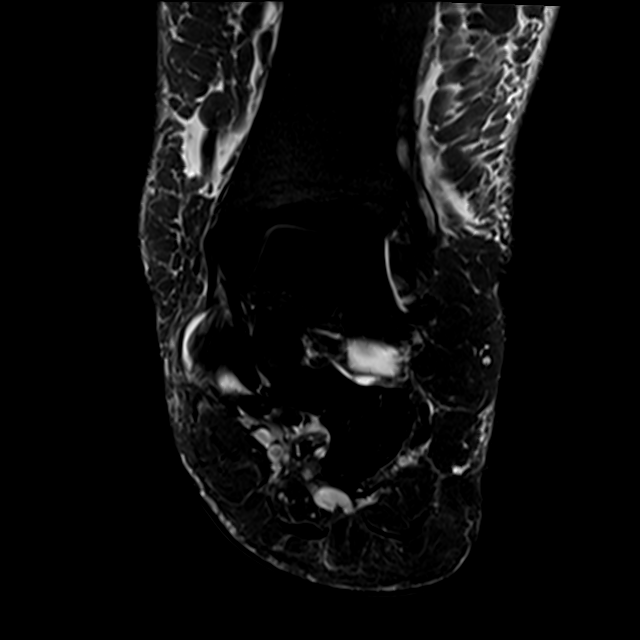
[im 29/33]
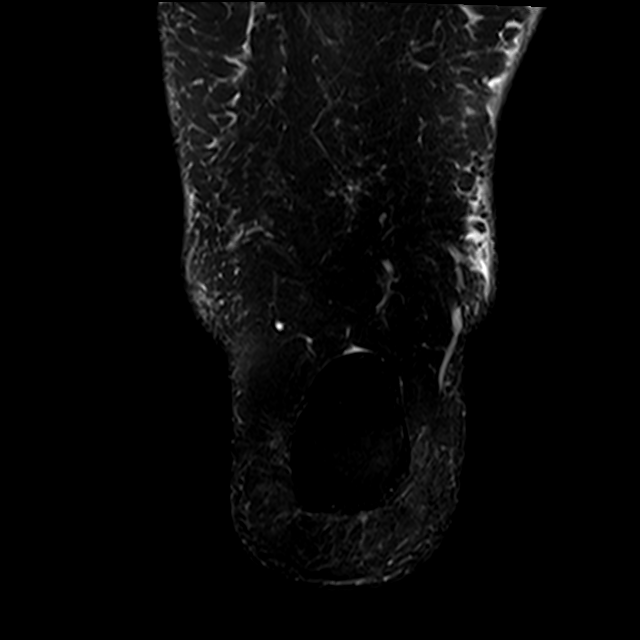

[15 of 40 positions shown; findings below may reference images not displayed]

FINDINGS: TENDONS

Peroneal: Peroneal longus tendon intact. Peroneal brevis intact.

Posteromedial: There is a split tear of the posterior tibial tendon
above and below the malleolus with tenosynovitis. Flexor digitorum
longus tendon intact. Flexor hallucis longus tendon intact.

Anterior: Tibialis anterior tendon intact. Extensor hallucis longus
tendon intact Extensor digitorum longus tendon intact.

Achilles:  Intact.

Plantar Fascia: Intact. Thickening of the medial bundle without
increased signal, possibly prior plantar fasciitis.

LIGAMENTS

Lateral: Anterior talofibular ligament intact. Calcaneofibular
ligament intact. Posterior talofibular ligament intact. Anterior and
posterior tibiofibular ligaments intact.

Medial: Deltoid ligament intact. Partial distal spring ligament
tearing, and thickening proximally likely scarring.

CARTILAGE

Ankle Joint: No joint effusion. Normal ankle mortise. No chondral
defect.

Subtalar Joints/Sinus Tarsi: Normal subtalar joints. No subtalar
joint effusion. Normal sinus tarsi.

Bones: No marrow signal abnormality.  No fracture or dislocation.

Soft Tissue: Superficial ankle soft tissue swelling as well as the
lateral foot.
IMPRESSION: Split tear of the posterior tibial tendon above and below the
malleolus with tenosynovitis.

Partial distal spring ligament tearing of the superomedial portion.

## 2021-07-11 ENCOUNTER — Ambulatory Visit: Payer: BC Managed Care – PPO | Admitting: Podiatry

## 2021-07-11 ENCOUNTER — Other Ambulatory Visit: Payer: Self-pay

## 2021-07-11 DIAGNOSIS — M7989 Other specified soft tissue disorders: Secondary | ICD-10-CM | POA: Diagnosis not present

## 2021-07-11 DIAGNOSIS — M76822 Posterior tibial tendinitis, left leg: Secondary | ICD-10-CM | POA: Diagnosis not present

## 2021-07-14 NOTE — Progress Notes (Signed)
Subjective: 65 year old female presents the office for follow-up evaluation of left ankle discomfort, split tear posterior tibial tendon and spring ligament partial tear.  She said the injection ankle helped some but she still having quite a bit of pain.  She not seeing any improvement she points on the medial aspect ankle and the course the posterior tibial tendon where she still gets pain and swelling.  She is accompanied today by her daughter.  No other concerns today.   Objective: AAO x3, NAD DP, PT pulses palpable bilaterally 2/4.   There is continued tenderness along palpation along the course of the posterior tibial tendon there is localized edema to the area although the edema is mild.  No significant discomfort on the anterior ankle joint line today.  Decreased medial arch upon weightbearing.  Ankle joint range of motion intact but any restrictions.  No pain with Achilles tendon. No pain with calf compression, swelling, warmth, erythema  Assessment: 65 year old female with posterior tibial tendon dysfunction, partial tearing; capsulitis ankle  Plan: -All treatment options discussed with the patient including all alternatives, risks, complications.  -Minimal discussion today in regards to treatment options both conservatively as well as surgical.  At this point do not see any improvement and she is having quite a bit of pain still present.  I discussed with her at this time surgical intervention to include the surgical tendon repair.  Discussed with her the repair of the tendon, PRP injection.  Discussed that long-term she still need the orthotics to help support the arch of her foot to help prevent reoccurrence.  We can discuss at the time of surgery doing a flatfoot repair however after discussion regarding solely fix the posterior tibial tendon at this time. -The incision placement as well as the postoperative course was discussed with the patient. I discussed risks of the surgery which  include, but not limited to, infection, bleeding, pain, swelling, need for further surgery, delayed or nonhealing, painful or ugly scar, numbness or sensation changes, over/under correction, recurrence, transfer lesions, further deformity,DVT/PE, loss of toe/foot. Patient understands these risks and wishes to proceed with surgery. The surgical consent was reviewed with the patient all 3 pages were signed. No promises or guarantees were given to the outcome of the procedure. All questions were answered to the best of my ability. Before the surgery the patient was encouraged to call the office if there is any further questions. The surgery will be performed at the [facility] on an outpatient basis. -Today steroid injections formed to the anterior medial aspect ankle joint on the left side.  The skin was prepped with Betadine, alcohol and mixture 1 cc Kenalog 10, 1 cc lidocaine plain was infiltrated to the anterior medial aspect ankle joint no complications.  Postinjection care discussed.  She tolerated procedure well. -She is remote history of DVT.  We will check a venous duplex.  Vivi Barrack DPM

## 2021-07-16 ENCOUNTER — Telehealth: Payer: Self-pay | Admitting: *Deleted

## 2021-07-16 DIAGNOSIS — Z1231 Encounter for screening mammogram for malignant neoplasm of breast: Secondary | ICD-10-CM | POA: Diagnosis not present

## 2021-07-16 NOTE — Telephone Encounter (Signed)
Patient has an appointment on 07/18/2021 at [facility]. Misty Stanley

## 2021-07-16 NOTE — Telephone Encounter (Signed)
-----   Message from Vivi Barrack, DPM sent at 07/14/2021  8:52 AM EST ----- I ordered a venous duplex for the left side to be done prior to surgery.  Can you please make sure this is done?  Thank you.

## 2021-07-18 ENCOUNTER — Ambulatory Visit (HOSPITAL_COMMUNITY)
Admission: RE | Admit: 2021-07-18 | Discharge: 2021-07-18 | Disposition: A | Discharge status: Home / Self Care | Payer: BC Managed Care – PPO | Source: Ambulatory Visit | Attending: Cardiovascular Disease | Admitting: Cardiovascular Disease

## 2021-07-18 ENCOUNTER — Other Ambulatory Visit: Payer: Self-pay

## 2021-07-18 DIAGNOSIS — M7989 Other specified soft tissue disorders: Secondary | ICD-10-CM | POA: Insufficient documentation

## 2021-07-23 ENCOUNTER — Telehealth: Payer: Self-pay | Admitting: Urology

## 2021-07-23 ENCOUNTER — Telehealth: Payer: Self-pay | Admitting: Podiatry

## 2021-07-23 NOTE — Telephone Encounter (Signed)
Patient called - stating that she was to have 2 prescriptions sent into the [facility] in [place], they have not been called in yet. (She would like to get them before it gets too late.)

## 2021-07-23 NOTE — Telephone Encounter (Signed)
DOS - 07/24/21   REPAIR TENDON LEFT --- 28086  [employer] EFFECTIVE DATE - 09/09/19   RECEIVED FAX FROM [employer] STATING THAT CPT CODE 33383 HAS BEEN APPROVED, AUTH # [id number],GOOD FROM 07/15/21 - 01/12/22.

## 2021-07-24 ENCOUNTER — Encounter: Payer: Self-pay | Admitting: Podiatry

## 2021-07-24 ENCOUNTER — Other Ambulatory Visit: Payer: Self-pay | Admitting: Podiatry

## 2021-07-24 DIAGNOSIS — W19XXXA Unspecified fall, initial encounter: Secondary | ICD-10-CM | POA: Diagnosis not present

## 2021-07-24 DIAGNOSIS — M76822 Posterior tibial tendinitis, left leg: Secondary | ICD-10-CM

## 2021-07-24 DIAGNOSIS — M25572 Pain in left ankle and joints of left foot: Secondary | ICD-10-CM | POA: Diagnosis not present

## 2021-07-24 DIAGNOSIS — Y929 Unspecified place or not applicable: Secondary | ICD-10-CM | POA: Diagnosis not present

## 2021-07-24 DIAGNOSIS — S96812A Strain of other specified muscles and tendons at ankle and foot level, left foot, initial encounter: Secondary | ICD-10-CM | POA: Diagnosis not present

## 2021-07-24 MED ORDER — PROMETHAZINE HCL 25 MG PO TABS: 25.0000 mg | ORAL_TABLET | Freq: Three times a day (TID) | ORAL | 0 refills | Status: DC | PRN

## 2021-07-24 MED ORDER — ENOXAPARIN SODIUM 40 MG/0.4ML IJ SOSY: 40.0000 mg | PREFILLED_SYRINGE | INTRAMUSCULAR | 0 refills | Status: DC

## 2021-07-24 MED ORDER — CEPHALEXIN 500 MG PO CAPS: 500.0000 mg | ORAL_CAPSULE | Freq: Three times a day (TID) | ORAL | 0 refills | Status: DC

## 2021-07-24 MED ORDER — OXYCODONE-ACETAMINOPHEN 5-325 MG PO TABS: 1.0000 | ORAL_TABLET | Freq: Four times a day (QID) | ORAL | 0 refills | Status: DC | PRN

## 2021-07-24 NOTE — Progress Notes (Signed)
We discussed postop DVT prophylaxis give history of right leg DVT and immobilization/obesity. Will plan for 14 days of Lovenox.

## 2021-07-26 ENCOUNTER — Telehealth: Payer: Self-pay | Admitting: *Deleted

## 2021-07-26 ENCOUNTER — Telehealth: Payer: Self-pay | Admitting: Podiatry

## 2021-07-26 NOTE — Telephone Encounter (Signed)
Patient called and stated that she is having severe pain while wearing the boot, feels like it swelling. Patient would like to know what to do?  Please advise

## 2021-07-26 NOTE — Telephone Encounter (Signed)
Called and left a message for the patient stating that I was calling to see how the patient was doing after having surgery with Dr Ardelle Anton and I looked in the patient's chart and patient has left a message for Dr Ardelle Anton today to call patient back. Misty Stanley

## 2021-07-29 ENCOUNTER — Ambulatory Visit (INDEPENDENT_AMBULATORY_CARE_PROVIDER_SITE_OTHER): Payer: BC Managed Care – PPO | Admitting: Podiatry

## 2021-07-29 ENCOUNTER — Other Ambulatory Visit: Payer: Self-pay

## 2021-07-29 DIAGNOSIS — Z9889 Other specified postprocedural states: Secondary | ICD-10-CM

## 2021-07-29 DIAGNOSIS — M76822 Posterior tibial tendinitis, left leg: Secondary | ICD-10-CM

## 2021-07-29 MED ORDER — OXYCODONE-ACETAMINOPHEN 10-325 MG PO TABS: 1.0000 | ORAL_TABLET | ORAL | 0 refills | Status: DC | PRN

## 2021-07-30 DIAGNOSIS — M79676 Pain in unspecified toe(s): Secondary | ICD-10-CM

## 2021-08-04 NOTE — Progress Notes (Signed)
Subjective: Jocelyn White is a 65 y.o. is seen today in office s/p left posterior tibial tendon repair preformed on 07/24/2021.  States that she still having discomfort.  She has been transcatheter is much as possible.  She is been icing elevating.  She is been using the cam boot.  She still using Lovenox injection without any side effects.    Objective: General: No acute distress, AAOx3  DP/PT pulses palpable 2/4, CRT < 3 sec to all digits.  Protective sensation intact. Motor function intact.  Left foot: Incision is well coapted without any evidence of dehiscence with sutures, staples intact. There is no surrounding erythema, ascending cellulitis, fluctuance, crepitus, malodor, drainage/purulence. There is mild edema around the surgical site. There is mild pain along the surgical site.  No other areas of tenderness to bilateral lower extremities.  No other open lesions or pre-ulcerative lesions.  No pain with calf compression, swelling, warmth, erythema.   Assessment and Plan:  Status post left foot posterior tibial tendon repair, doing well with no complications   -Treatment options discussed including all alternatives, risks, and complications -Dressing was changed today.  A small amount of antibiotic ointment was applied followed by a bandage.  For now we will continue cam boot, nonweightbearing.  I do not think she will be able to tolerate the cast. -Ice/elevation -Pain medication as needed-refill today -Continue Lovenox injections.  Hold anti-inflammatories. -Monitor for any clinical signs or symptoms of infection and DVT/PE and directed to call the office immediately should any occur or go to the ER. -Follow-up as scheduled or sooner if any problems arise. In the meantime, encouraged to call the office with any questions, concerns, change in symptoms.   Ovid Curd, DPM

## 2021-08-08 ENCOUNTER — Other Ambulatory Visit: Payer: Self-pay

## 2021-08-08 ENCOUNTER — Ambulatory Visit (INDEPENDENT_AMBULATORY_CARE_PROVIDER_SITE_OTHER): Payer: BC Managed Care – PPO | Admitting: Podiatry

## 2021-08-08 ENCOUNTER — Other Ambulatory Visit: Payer: Self-pay | Admitting: Podiatry

## 2021-08-08 DIAGNOSIS — M76822 Posterior tibial tendinitis, left leg: Secondary | ICD-10-CM

## 2021-08-08 DIAGNOSIS — Z9889 Other specified postprocedural states: Secondary | ICD-10-CM

## 2021-08-08 MED ORDER — OXYCODONE-ACETAMINOPHEN 10-325 MG PO TABS
1.0000 | ORAL_TABLET | ORAL | 0 refills | Status: DC | PRN
Start: 2021-08-08 — End: 2023-04-11

## 2021-08-11 NOTE — Progress Notes (Signed)
Subjective: Jocelyn White is a 65 y.o. is seen today in office s/p left posterior tibial tendon repair preformed on 07/24/2021.  States that she still having discomfort but she is doing somewhat better.  She presents today for suture removal.  She denies any fevers or chills.  No chest pain, shortness of breath.  Objective: General: No acute distress, AAOx3  DP/PT pulses palpable 2/4, CRT < 3 sec to all digits.  Protective sensation intact. Motor function intact.  Left foot: Incision is well coapted without any evidence of dehiscence with sutures, staples intact. There is no surrounding erythema, ascending cellulitis, fluctuance, crepitus, malodor, drainage/purulence. There is mild edema around the surgical site but appears to be somewhat improved.  There is mild pain along the surgical site.  No other areas of tenderness to bilateral lower extremities.  No other open lesions or pre-ulcerative lesions.  No pain with calf compression, swelling, warmth, erythema.   Assessment and Plan:  Status post left foot posterior tibial tendon repair, doing well with no complications   -Treatment options discussed including all alternatives, risks, and complications -Sutures removed today.  Staple intact.  A small amount of antibiotic ointment was applied followed by dressing.  She will start to wash the foot with soap and water and dry thoroughly but I would not submerge the foot.  Continue cam boot, nonweightbearing.  Discussed range of motion exercises to help with circulation as well. -Monitor for any clinical signs or symptoms of infection and DVT/PE and directed to call the office immediately should any occur or go to the ER. -Follow-up as scheduled or sooner if any problems arise. In the meantime, encouraged to call the office with any questions, concerns, change in symptoms.   Ovid Curd, DPM

## 2021-08-22 ENCOUNTER — Other Ambulatory Visit: Payer: Self-pay

## 2021-08-22 ENCOUNTER — Ambulatory Visit (INDEPENDENT_AMBULATORY_CARE_PROVIDER_SITE_OTHER): Payer: BC Managed Care – PPO | Admitting: Podiatry

## 2021-08-22 DIAGNOSIS — Z9889 Other specified postprocedural states: Secondary | ICD-10-CM

## 2021-08-22 DIAGNOSIS — M76822 Posterior tibial tendinitis, left leg: Secondary | ICD-10-CM

## 2021-08-25 NOTE — Progress Notes (Signed)
Subjective: Jocelyn White is a 65 y.o. is seen today in office s/p left posterior tibial tendon repair preformed on 07/24/2021. She presents today for staple removal.  Some some intermittent discomfort but overall improving.  Denies any fevers or chills.  No chest pain, shortness of breath.  No new concerns.   Objective: General: No acute distress, AAOx3  DP/PT pulses palpable 2/4, CRT < 3 sec to all digits.  Protective sensation intact. Motor function intact.  Left foot: Incision is well coapted without any evidence of dehiscence with staples intact.  There is minimal edema still present there is no erythema or warmth.  Incision is well coapted with any signs of dehiscence or infection.  Mild tenderness palpation along the surgical site inferior to the medial malleolus on the course the posterior tibial, flexor tendons.  No other areas of discomfort identified today. No other open lesions or pre-ulcerative lesions.  No pain with calf compression, swelling, warmth, erythema.   Assessment and Plan:  Status post left foot posterior tibial tendon repair, doing well with no complications   -Treatment options discussed including all alternatives, risks, and complications -Staples removed today without complications.  Incision is well coapted.  Steri-Strips applied for reinforcement.  A small amount of antibiotic ointment was applied followed by dressing.  Discussed that she can change the bandage and wash the foot with soap and water but do not soak the foot. -Continue nonweightbearing in cam boot -Ice and elevation -Monitor for any clinical signs or symptoms of infection and DVT/PE and directed to call the office immediately should any occur or go to the ER. -Follow-up as scheduled or sooner if any problems arise. In the meantime, encouraged to call the office with any questions, concerns, change in symptoms.   Ovid Curd, DPM

## 2021-09-05 ENCOUNTER — Encounter: Payer: BC Managed Care – PPO | Admitting: Podiatry

## 2021-09-08 HISTORY — PX: FRACTURE SURGERY: SHX138

## 2021-09-10 ENCOUNTER — Other Ambulatory Visit: Payer: Self-pay

## 2021-09-10 ENCOUNTER — Ambulatory Visit (INDEPENDENT_AMBULATORY_CARE_PROVIDER_SITE_OTHER): Payer: BC Managed Care – PPO | Admitting: Podiatry

## 2021-09-10 DIAGNOSIS — M76822 Posterior tibial tendinitis, left leg: Secondary | ICD-10-CM

## 2021-09-10 MED ORDER — CEPHALEXIN 500 MG PO CAPS: 500.0000 mg | ORAL_CAPSULE | Freq: Three times a day (TID) | ORAL | 0 refills | Status: DC

## 2021-09-10 MED ORDER — MUPIROCIN 2 % EX OINT: 1.0000 "application " | TOPICAL_OINTMENT | Freq: Two times a day (BID) | CUTANEOUS | 2 refills | Status: DC

## 2021-09-12 NOTE — Progress Notes (Signed)
Subjective: Jocelyn White is a 66 y.o. is seen today in office s/p left posterior tibial tendon repair preformed on 07/24/2021.  She was originally on Dr. Ralene Cork schedule but I was happy to see her today as her appointment rescheduled last week as well as out sick.  She presents today with her daughter.  They are concerned that noted some drainage coming from the incision the area has scabbed over.  No increase in redness or warmth.  Denies any fevers or chills.  She has no other concerns today.    Objective: General: No acute distress, AAOx3  DP/PT pulses palpable 2/4, CRT < 3 sec to all digits.  Protective sensation intact. Motor function intact.  Left foot: Incision is well coapted without any evidence of dehiscence and scar is formed on the proximal two thirds of the left distal one third scab is present overlying the area.  I am unable to identify any significant drainage today and there is no purulence.  There is no fluctuation or crepitation.  There is no surrounding erythema, ascending cellulitis.  No definitive ulceration or dehiscence I can see you as it is covered with callus was difficult to fully tell.  Mild edema along the course of the posterior tibial, flexor tendons.  No other open lesions or pre-ulcerative lesions.  No pain with calf compression, swelling, warmth, erythema.   Assessment and Plan:  Status post left foot posterior tibial tendon repair, scab, mild drainage reported  -Treatment options discussed including all alternatives, risks, and complications -I did not take a culture today there is no significant drainage to that do this and it also be a superficial culture that would not yield much value. -I would like for her to remain nonweightbearing given this.  Recommend mupirocin ointment dressing changes daily which I ordered as well as IV order cephalexin.  Monitor closely for any signs or symptoms of worsening drainage or any signs of infection should any occur to  let me know immediately.  Continue to elevate the foot as well. -Having some issues with work.  I will extend her out of work until November 06, 2021.  Return in about 1 week (around 09/17/2021).  Vivi Barrack DPM

## 2021-09-17 ENCOUNTER — Other Ambulatory Visit: Payer: Self-pay

## 2021-09-17 ENCOUNTER — Ambulatory Visit (INDEPENDENT_AMBULATORY_CARE_PROVIDER_SITE_OTHER): Payer: BC Managed Care – PPO | Admitting: Podiatry

## 2021-09-17 DIAGNOSIS — Z9889 Other specified postprocedural states: Secondary | ICD-10-CM

## 2021-09-17 DIAGNOSIS — M76822 Posterior tibial tendinitis, left leg: Secondary | ICD-10-CM

## 2021-09-22 NOTE — Progress Notes (Signed)
Subjective: Jocelyn White is a 66 y.o. is seen today in office s/p left posterior tibial tendon repair preformed on 07/24/2021.  She presents today for follow evaluation of the wound incision.  Scab is still present.  She has seen some drainage but not able to identify this person today.  No increase in swelling or any redness.  She denies any fevers or chills.  She has no other concerns today.   Objective: General: No acute distress, AAOx3  DP/PT pulses palpable 2/4, CRT < 3 sec to all digits.  Protective sensation intact. Motor function intact.  Left foot: Incision is well coapted without any evidence of dehiscence and scar is formed on the proximal two thirds of the left distal one third scab is present overlying the area.  The scar is starting to loosen up, often.  She is smaller today.  I think the actual incision looks better today than it did last appointment.  Where she is concerned that is about the same from last appointment is more in regard to the swelling and discomfort that she has some more along the surgical site on the posterior tibial tendon.  There is no actual pain on exam today along the incision.  There is no fluctuation or crepitation.  There is no malodor. No other open lesions or pre-ulcerative lesions.  No pain with calf compression, swelling, warmth, erythema.   Assessment and Plan:  Status post left foot posterior tibial tendon repair, scab  -Treatment options discussed including all alternatives, risks, and complications -Did lightly debride some of the loose tissue to any complications or bleeding.  Recommend continuing daily dressing changes.  For now I want her to remain in the cam boot for immobilization until the scab is come off. Compression, elevation, icing to help with swelling.  Ongoing refer to physical therapy to help with general rehab but would hold off on the ankle until the wound is healed.  Will refer to [facility] physical therapy.  Trula Slade DPM

## 2021-09-23 ENCOUNTER — Other Ambulatory Visit: Payer: Self-pay | Admitting: Podiatry

## 2021-09-23 NOTE — Telephone Encounter (Signed)
Please advise 

## 2021-09-27 ENCOUNTER — Other Ambulatory Visit: Payer: Self-pay

## 2021-09-27 ENCOUNTER — Other Ambulatory Visit: Payer: Self-pay | Admitting: Podiatry

## 2021-09-27 ENCOUNTER — Ambulatory Visit (INDEPENDENT_AMBULATORY_CARE_PROVIDER_SITE_OTHER): Payer: BC Managed Care – PPO | Admitting: Podiatry

## 2021-09-27 DIAGNOSIS — Z9889 Other specified postprocedural states: Secondary | ICD-10-CM

## 2021-09-27 DIAGNOSIS — M76822 Posterior tibial tendinitis, left leg: Secondary | ICD-10-CM

## 2021-10-01 DIAGNOSIS — R531 Weakness: Secondary | ICD-10-CM | POA: Diagnosis not present

## 2021-10-01 DIAGNOSIS — Z4789 Encounter for other orthopedic aftercare: Secondary | ICD-10-CM | POA: Diagnosis not present

## 2021-10-01 DIAGNOSIS — R262 Difficulty in walking, not elsewhere classified: Secondary | ICD-10-CM | POA: Diagnosis not present

## 2021-10-01 DIAGNOSIS — M25572 Pain in left ankle and joints of left foot: Secondary | ICD-10-CM | POA: Diagnosis not present

## 2021-10-01 NOTE — Progress Notes (Signed)
Subjective: Jocelyn White is a 66 y.o. is seen today in office s/p left posterior tibial tendon repair preformed on 07/24/2021.  States a scab is still present.  He has some minimal bloody drainage but no purulence.  No recent redness.  Still gets some swelling discomfort mostly in the surgical site.  She denies any fevers or chills.  She has no new concerns otherwise today.   Objective: General: No acute distress, AAOx3  DP/PT pulses palpable 2/4, CRT < 3 sec to all digits.  Protective sensation intact. Motor function intact.  Left foot: Incision is well coapted without any evidence of dehiscence and scar is formed on the proximal two thirds of the left distal one third scab is present overlying the area.  Scab is more narrow today is coming off.  There is no ulceration or opening of the incision I can see.  There is no surrounding erythema, drainage or pus.  No fluctuation or crepitation.  There is no malodor.  There is some mild edema present mostly along the posterior tibial, flexor tendons and this is posterior to the area of the incision, scab.  There is no erythema associate with this.  Mild discomfort of this area. No other open lesions or pre-ulcerative lesions.  No pain with calf compression, swelling, warmth, erythema.   Assessment and Plan:  Status post left foot posterior tibial tendon repair, scab  -Treatment options discussed including all alternatives, risks, and complications -Discussed washing scalp with soap and water daily.  Dry thoroughly apply bandage.  Continue cam boot for now.  Referral was sent for [facility] physical therapy.  Discussed general rehab but she can start some range of motion exercises for her ankle.  Once the scabbing incisions completely healed we can start to transition to partial weightbearing as tolerated.  Continue with compression and elevation have any postoperative edema.  Monitor closely for any signs or symptoms  Vivi Barrack DPM

## 2021-10-04 DIAGNOSIS — Z4789 Encounter for other orthopedic aftercare: Secondary | ICD-10-CM | POA: Diagnosis not present

## 2021-10-04 DIAGNOSIS — M25572 Pain in left ankle and joints of left foot: Secondary | ICD-10-CM | POA: Diagnosis not present

## 2021-10-04 DIAGNOSIS — R262 Difficulty in walking, not elsewhere classified: Secondary | ICD-10-CM | POA: Diagnosis not present

## 2021-10-04 DIAGNOSIS — R531 Weakness: Secondary | ICD-10-CM | POA: Diagnosis not present

## 2021-10-08 DIAGNOSIS — Z4789 Encounter for other orthopedic aftercare: Secondary | ICD-10-CM | POA: Diagnosis not present

## 2021-10-08 DIAGNOSIS — R531 Weakness: Secondary | ICD-10-CM | POA: Diagnosis not present

## 2021-10-08 DIAGNOSIS — M25572 Pain in left ankle and joints of left foot: Secondary | ICD-10-CM | POA: Diagnosis not present

## 2021-10-08 DIAGNOSIS — R262 Difficulty in walking, not elsewhere classified: Secondary | ICD-10-CM | POA: Diagnosis not present

## 2021-10-10 DIAGNOSIS — M25572 Pain in left ankle and joints of left foot: Secondary | ICD-10-CM | POA: Diagnosis not present

## 2021-10-10 DIAGNOSIS — R262 Difficulty in walking, not elsewhere classified: Secondary | ICD-10-CM | POA: Diagnosis not present

## 2021-10-10 DIAGNOSIS — R531 Weakness: Secondary | ICD-10-CM | POA: Diagnosis not present

## 2021-10-10 DIAGNOSIS — Z4789 Encounter for other orthopedic aftercare: Secondary | ICD-10-CM | POA: Diagnosis not present

## 2021-10-14 DIAGNOSIS — R262 Difficulty in walking, not elsewhere classified: Secondary | ICD-10-CM | POA: Diagnosis not present

## 2021-10-14 DIAGNOSIS — Z4789 Encounter for other orthopedic aftercare: Secondary | ICD-10-CM | POA: Diagnosis not present

## 2021-10-14 DIAGNOSIS — R531 Weakness: Secondary | ICD-10-CM | POA: Diagnosis not present

## 2021-10-14 DIAGNOSIS — M25572 Pain in left ankle and joints of left foot: Secondary | ICD-10-CM | POA: Diagnosis not present

## 2021-10-15 ENCOUNTER — Other Ambulatory Visit: Payer: Self-pay

## 2021-10-15 ENCOUNTER — Ambulatory Visit (INDEPENDENT_AMBULATORY_CARE_PROVIDER_SITE_OTHER): Payer: BC Managed Care – PPO | Admitting: Podiatry

## 2021-10-15 DIAGNOSIS — Z9889 Other specified postprocedural states: Secondary | ICD-10-CM

## 2021-10-15 DIAGNOSIS — M76822 Posterior tibial tendinitis, left leg: Secondary | ICD-10-CM

## 2021-10-17 DIAGNOSIS — Z4789 Encounter for other orthopedic aftercare: Secondary | ICD-10-CM | POA: Diagnosis not present

## 2021-10-17 DIAGNOSIS — M25572 Pain in left ankle and joints of left foot: Secondary | ICD-10-CM | POA: Diagnosis not present

## 2021-10-17 DIAGNOSIS — R262 Difficulty in walking, not elsewhere classified: Secondary | ICD-10-CM | POA: Diagnosis not present

## 2021-10-17 DIAGNOSIS — R531 Weakness: Secondary | ICD-10-CM | POA: Diagnosis not present

## 2021-10-17 NOTE — Progress Notes (Addendum)
Subjective: Jocelyn White is a 66 y.o. is seen today in office s/p left posterior tibial tendon repair preformed on 07/24/2021.  She reports random discomfort but not on a continual basis.  Scab is starting, more on the incision site.  Currently no drainage or any pus.  Decreased swelling.  She has been doing physical therapy and she feels that she is ready to start putting weight on her foot.  She has no fevers or chills.  She has no other concerns.  Objective: General: No acute distress, AAOx3 -presents with daughter present DP/PT pulses palpable 2/4, CRT < 3 sec to all digits.  Protective sensation intact. Motor function intact.  Left foot: Incision is well coapted without any evidence of dehiscence and scar is formed on the proximal two thirds of the left distal one third scab is present overlying the area.  Scab is narrowed almost completely off there is no opening of the incision.  There is no surrounding erythema, drainage or pus or any signs of infection but there is decreased edema.  No significant tenderness palpation today on the surgical site which appears to be improved compared to prior appointments. No other open lesions or pre-ulcerative lesions.  No pain with calf compression, swelling, warmth, erythema.   Assessment and Plan:  Status post left foot posterior tibial tendon repair, scab-improving swelling, pain  -Treatment options discussed including all alternatives, risks, and complications -Incision is healing better.  I would continue with small amount of antibiotic ointment during the day we discussed this on the past ointment as well. -At this point she is in continue physical therapy.  Discussed that she can start partial weightbearing in the cam boot.  As she progresses she can start to transition to a shoe with only physical therapy for now to help improve her strength. -Continue ice elevate. -She will not be able to return to work till December 09, 2021 as she is still  in physical therapy not able to put full weight on her foot now and  she is still in the cam boot. -Monitor for any clinical signs or symptoms of infection and directed to call the office immediately should any occur or go to the ER.  Return in about 3 weeks (around 11/05/2021).  Vivi Barrack DPM

## 2021-10-24 DIAGNOSIS — M25572 Pain in left ankle and joints of left foot: Secondary | ICD-10-CM | POA: Diagnosis not present

## 2021-10-24 DIAGNOSIS — R262 Difficulty in walking, not elsewhere classified: Secondary | ICD-10-CM | POA: Diagnosis not present

## 2021-10-24 DIAGNOSIS — R531 Weakness: Secondary | ICD-10-CM | POA: Diagnosis not present

## 2021-10-24 DIAGNOSIS — Z4789 Encounter for other orthopedic aftercare: Secondary | ICD-10-CM | POA: Diagnosis not present

## 2021-10-28 ENCOUNTER — Encounter: Payer: Self-pay | Admitting: Podiatry

## 2021-10-28 DIAGNOSIS — Z4789 Encounter for other orthopedic aftercare: Secondary | ICD-10-CM | POA: Diagnosis not present

## 2021-10-28 DIAGNOSIS — R531 Weakness: Secondary | ICD-10-CM | POA: Diagnosis not present

## 2021-10-28 DIAGNOSIS — R262 Difficulty in walking, not elsewhere classified: Secondary | ICD-10-CM | POA: Diagnosis not present

## 2021-10-28 DIAGNOSIS — M25572 Pain in left ankle and joints of left foot: Secondary | ICD-10-CM | POA: Diagnosis not present

## 2021-10-29 ENCOUNTER — Other Ambulatory Visit: Payer: Self-pay | Admitting: Podiatry

## 2021-10-29 MED ORDER — OXYCODONE-ACETAMINOPHEN 5-325 MG PO TABS: 1.0000 | ORAL_TABLET | Freq: Four times a day (QID) | ORAL | 0 refills | Status: DC | PRN

## 2021-10-29 NOTE — Progress Notes (Signed)
Patient called and she states she has been having some pain with PT. Sent percocet 5/325

## 2021-10-31 DIAGNOSIS — R531 Weakness: Secondary | ICD-10-CM | POA: Diagnosis not present

## 2021-10-31 DIAGNOSIS — Z4789 Encounter for other orthopedic aftercare: Secondary | ICD-10-CM | POA: Diagnosis not present

## 2021-10-31 DIAGNOSIS — R262 Difficulty in walking, not elsewhere classified: Secondary | ICD-10-CM | POA: Diagnosis not present

## 2021-10-31 DIAGNOSIS — M25572 Pain in left ankle and joints of left foot: Secondary | ICD-10-CM | POA: Diagnosis not present

## 2021-11-04 DIAGNOSIS — Z4789 Encounter for other orthopedic aftercare: Secondary | ICD-10-CM | POA: Diagnosis not present

## 2021-11-04 DIAGNOSIS — M25572 Pain in left ankle and joints of left foot: Secondary | ICD-10-CM | POA: Diagnosis not present

## 2021-11-04 DIAGNOSIS — R262 Difficulty in walking, not elsewhere classified: Secondary | ICD-10-CM | POA: Diagnosis not present

## 2021-11-04 DIAGNOSIS — R531 Weakness: Secondary | ICD-10-CM | POA: Diagnosis not present

## 2021-11-05 ENCOUNTER — Other Ambulatory Visit: Payer: Self-pay

## 2021-11-05 ENCOUNTER — Ambulatory Visit: Payer: BC Managed Care – PPO | Admitting: Podiatry

## 2021-11-05 DIAGNOSIS — Z9889 Other specified postprocedural states: Secondary | ICD-10-CM

## 2021-11-05 DIAGNOSIS — M76822 Posterior tibial tendinitis, left leg: Secondary | ICD-10-CM

## 2021-11-06 NOTE — Progress Notes (Signed)
Subjective: Jocelyn White is a 66 y.o. is seen today in office s/p left posterior tibial tendon repair preformed on 07/24/2021.  She states that she has been increased her activity with physical therapy.  She states she has been getting some increased pain since trying to be on her feet more and being more weightbearing the cam boot.  She is also try to get around the house more with putting weight on the foot around the house in the cam boot.  No recent injury or falls otherwise.  The scab does come off the incision.  Denies any drainage.  No fevers or chills.  Objective: General: No acute distress, AAOx3 -presents with daughter present DP/PT pulses palpable 2/4, CRT < 3 sec to all digits.  Protective sensation intact. Motor function intact.  Left foot: Scar has formed.  The scab is completely off of the incision.  See picture below.  There is mild edema present.  There is no significant tenderness palpation along the course or insertion of the posterior tibial tendon.  Clinically the tendon appears to be intact. No other open lesions or pre-ulcerative lesions.  No pain with calf compression, swelling, warmth, erythema.      Assessment and Plan:  Status post left foot posterior tibial tendon repair, scab-improving swelling, pain  -Treatment options discussed including all alternatives, risks, and complications -At this point she is still in the cam boot and not able to walk for long distances without the boot so therefore I do recommend her to stay out of work.  Our expected return to work date is December 09, 2021.  For now continue physical therapy.  Gradual increase activity level as tolerated.  Continue ice and elevate. -She desires to change physical therapist.  Updated referral to [facility] placed.  Vivi Barrack DPM

## 2021-11-07 DIAGNOSIS — Z4789 Encounter for other orthopedic aftercare: Secondary | ICD-10-CM | POA: Diagnosis not present

## 2021-11-07 DIAGNOSIS — M25572 Pain in left ankle and joints of left foot: Secondary | ICD-10-CM | POA: Diagnosis not present

## 2021-11-07 DIAGNOSIS — R262 Difficulty in walking, not elsewhere classified: Secondary | ICD-10-CM | POA: Diagnosis not present

## 2021-11-07 DIAGNOSIS — R531 Weakness: Secondary | ICD-10-CM | POA: Diagnosis not present

## 2021-11-11 DIAGNOSIS — M25572 Pain in left ankle and joints of left foot: Secondary | ICD-10-CM | POA: Diagnosis not present

## 2021-11-11 DIAGNOSIS — R531 Weakness: Secondary | ICD-10-CM | POA: Diagnosis not present

## 2021-11-11 DIAGNOSIS — R262 Difficulty in walking, not elsewhere classified: Secondary | ICD-10-CM | POA: Diagnosis not present

## 2021-11-11 DIAGNOSIS — Z4789 Encounter for other orthopedic aftercare: Secondary | ICD-10-CM | POA: Diagnosis not present

## 2021-11-13 DIAGNOSIS — M25572 Pain in left ankle and joints of left foot: Secondary | ICD-10-CM | POA: Diagnosis not present

## 2021-11-13 DIAGNOSIS — M62572 Muscle wasting and atrophy, not elsewhere classified, left ankle and foot: Secondary | ICD-10-CM | POA: Diagnosis not present

## 2021-11-13 DIAGNOSIS — M76822 Posterior tibial tendinitis, left leg: Secondary | ICD-10-CM | POA: Diagnosis not present

## 2021-11-13 DIAGNOSIS — M25672 Stiffness of left ankle, not elsewhere classified: Secondary | ICD-10-CM | POA: Diagnosis not present

## 2021-11-14 DIAGNOSIS — R531 Weakness: Secondary | ICD-10-CM | POA: Diagnosis not present

## 2021-11-14 DIAGNOSIS — M25572 Pain in left ankle and joints of left foot: Secondary | ICD-10-CM | POA: Diagnosis not present

## 2021-11-14 DIAGNOSIS — R262 Difficulty in walking, not elsewhere classified: Secondary | ICD-10-CM | POA: Diagnosis not present

## 2021-11-14 DIAGNOSIS — Z4789 Encounter for other orthopedic aftercare: Secondary | ICD-10-CM | POA: Diagnosis not present

## 2021-11-15 DIAGNOSIS — M76822 Posterior tibial tendinitis, left leg: Secondary | ICD-10-CM | POA: Diagnosis not present

## 2021-11-15 DIAGNOSIS — M62572 Muscle wasting and atrophy, not elsewhere classified, left ankle and foot: Secondary | ICD-10-CM | POA: Diagnosis not present

## 2021-11-15 DIAGNOSIS — M25672 Stiffness of left ankle, not elsewhere classified: Secondary | ICD-10-CM | POA: Diagnosis not present

## 2021-11-15 DIAGNOSIS — M25572 Pain in left ankle and joints of left foot: Secondary | ICD-10-CM | POA: Diagnosis not present

## 2021-11-18 DIAGNOSIS — Z4789 Encounter for other orthopedic aftercare: Secondary | ICD-10-CM | POA: Diagnosis not present

## 2021-11-18 DIAGNOSIS — R531 Weakness: Secondary | ICD-10-CM | POA: Diagnosis not present

## 2021-11-18 DIAGNOSIS — M25672 Stiffness of left ankle, not elsewhere classified: Secondary | ICD-10-CM | POA: Diagnosis not present

## 2021-11-18 DIAGNOSIS — M62572 Muscle wasting and atrophy, not elsewhere classified, left ankle and foot: Secondary | ICD-10-CM | POA: Diagnosis not present

## 2021-11-18 DIAGNOSIS — M76822 Posterior tibial tendinitis, left leg: Secondary | ICD-10-CM | POA: Diagnosis not present

## 2021-11-18 DIAGNOSIS — M25572 Pain in left ankle and joints of left foot: Secondary | ICD-10-CM | POA: Diagnosis not present

## 2021-11-18 DIAGNOSIS — R262 Difficulty in walking, not elsewhere classified: Secondary | ICD-10-CM | POA: Diagnosis not present

## 2021-11-21 DIAGNOSIS — Z4789 Encounter for other orthopedic aftercare: Secondary | ICD-10-CM | POA: Diagnosis not present

## 2021-11-21 DIAGNOSIS — R262 Difficulty in walking, not elsewhere classified: Secondary | ICD-10-CM | POA: Diagnosis not present

## 2021-11-21 DIAGNOSIS — R531 Weakness: Secondary | ICD-10-CM | POA: Diagnosis not present

## 2021-11-21 DIAGNOSIS — M25572 Pain in left ankle and joints of left foot: Secondary | ICD-10-CM | POA: Diagnosis not present

## 2021-11-25 DIAGNOSIS — Z4789 Encounter for other orthopedic aftercare: Secondary | ICD-10-CM | POA: Diagnosis not present

## 2021-11-25 DIAGNOSIS — R262 Difficulty in walking, not elsewhere classified: Secondary | ICD-10-CM | POA: Diagnosis not present

## 2021-11-25 DIAGNOSIS — M25572 Pain in left ankle and joints of left foot: Secondary | ICD-10-CM | POA: Diagnosis not present

## 2021-11-25 DIAGNOSIS — R531 Weakness: Secondary | ICD-10-CM | POA: Diagnosis not present

## 2021-11-26 ENCOUNTER — Encounter: Payer: Self-pay | Admitting: Podiatry

## 2021-11-26 NOTE — Telephone Encounter (Signed)
Please advise 

## 2021-11-27 ENCOUNTER — Encounter: Payer: Self-pay | Admitting: Podiatry

## 2021-11-27 NOTE — Telephone Encounter (Signed)
Can you extend her until June 1st? Thanks! ?

## 2021-11-28 DIAGNOSIS — R531 Weakness: Secondary | ICD-10-CM | POA: Diagnosis not present

## 2021-11-28 DIAGNOSIS — Z4789 Encounter for other orthopedic aftercare: Secondary | ICD-10-CM | POA: Diagnosis not present

## 2021-11-28 DIAGNOSIS — M25572 Pain in left ankle and joints of left foot: Secondary | ICD-10-CM | POA: Diagnosis not present

## 2021-11-28 DIAGNOSIS — R262 Difficulty in walking, not elsewhere classified: Secondary | ICD-10-CM | POA: Diagnosis not present

## 2021-12-02 DIAGNOSIS — R531 Weakness: Secondary | ICD-10-CM | POA: Diagnosis not present

## 2021-12-02 DIAGNOSIS — R262 Difficulty in walking, not elsewhere classified: Secondary | ICD-10-CM | POA: Diagnosis not present

## 2021-12-02 DIAGNOSIS — M25572 Pain in left ankle and joints of left foot: Secondary | ICD-10-CM | POA: Diagnosis not present

## 2021-12-02 DIAGNOSIS — Z4789 Encounter for other orthopedic aftercare: Secondary | ICD-10-CM | POA: Diagnosis not present

## 2021-12-03 ENCOUNTER — Other Ambulatory Visit: Payer: Self-pay

## 2021-12-03 ENCOUNTER — Ambulatory Visit: Payer: BC Managed Care – PPO | Admitting: Podiatry

## 2021-12-03 DIAGNOSIS — Z9889 Other specified postprocedural states: Secondary | ICD-10-CM

## 2021-12-03 DIAGNOSIS — M76822 Posterior tibial tendinitis, left leg: Secondary | ICD-10-CM

## 2021-12-05 DIAGNOSIS — R262 Difficulty in walking, not elsewhere classified: Secondary | ICD-10-CM | POA: Diagnosis not present

## 2021-12-05 DIAGNOSIS — R531 Weakness: Secondary | ICD-10-CM | POA: Diagnosis not present

## 2021-12-05 DIAGNOSIS — Z4789 Encounter for other orthopedic aftercare: Secondary | ICD-10-CM | POA: Diagnosis not present

## 2021-12-05 DIAGNOSIS — M25572 Pain in left ankle and joints of left foot: Secondary | ICD-10-CM | POA: Diagnosis not present

## 2021-12-06 DIAGNOSIS — M25572 Pain in left ankle and joints of left foot: Secondary | ICD-10-CM | POA: Diagnosis not present

## 2021-12-06 DIAGNOSIS — M1711 Unilateral primary osteoarthritis, right knee: Secondary | ICD-10-CM | POA: Diagnosis not present

## 2021-12-08 NOTE — Progress Notes (Signed)
Subjective: ?Jocelyn White is a 66 y.o. is seen today in office s/p left posterior tibial tendon repair preformed on 07/24/2021.  States that she is doing better.  She is still doing physical therapy which is not helping.  She is walking with a walker wearing a regular shoe.  No fever chills or any other concerns. ? ?Objective: ?General: No acute distress, AAOx3 -presents with daughter present ?DP/PT pulses palpable 2/4, CRT < 3 sec to all digits.  ?Protective sensation intact. Motor function intact.  ?Left foot: Scar is formed.  There is no significant pain today on exam along the course the posterior tibial tendon.  There is some edema still present.  There is no erythema or warmth. ?No other open lesions or pre-ulcerative lesions.  ?No pain with calf compression, swelling, warmth, erythema.  ? ? ?Assessment and Plan:  ?Status post left foot posterior tibial tendon repair, improving ? ?-Treatment options discussed including all alternatives, risks, and complications ?-Continue physical therapy.  Continue with wearing regular shoe as tolerated discussed that she can use ankle brace if needed for short amount of time if she is doing on her feet a lot.  Expect her to continue make good progress with physical therapy.  However she still not able to return to work as she not able to stand for long periods and she still gets pain and swelling after doing so.  I extended her out of work date to June 1. ? ?Vivi Barrack DPM ?

## 2021-12-09 DIAGNOSIS — R531 Weakness: Secondary | ICD-10-CM | POA: Diagnosis not present

## 2021-12-09 DIAGNOSIS — R262 Difficulty in walking, not elsewhere classified: Secondary | ICD-10-CM | POA: Diagnosis not present

## 2021-12-09 DIAGNOSIS — Z4789 Encounter for other orthopedic aftercare: Secondary | ICD-10-CM | POA: Diagnosis not present

## 2021-12-09 DIAGNOSIS — M25572 Pain in left ankle and joints of left foot: Secondary | ICD-10-CM | POA: Diagnosis not present

## 2021-12-12 DIAGNOSIS — R262 Difficulty in walking, not elsewhere classified: Secondary | ICD-10-CM | POA: Diagnosis not present

## 2021-12-12 DIAGNOSIS — R531 Weakness: Secondary | ICD-10-CM | POA: Diagnosis not present

## 2021-12-12 DIAGNOSIS — Z4789 Encounter for other orthopedic aftercare: Secondary | ICD-10-CM | POA: Diagnosis not present

## 2021-12-12 DIAGNOSIS — M25572 Pain in left ankle and joints of left foot: Secondary | ICD-10-CM | POA: Diagnosis not present

## 2021-12-16 DIAGNOSIS — R531 Weakness: Secondary | ICD-10-CM | POA: Diagnosis not present

## 2021-12-16 DIAGNOSIS — R262 Difficulty in walking, not elsewhere classified: Secondary | ICD-10-CM | POA: Diagnosis not present

## 2021-12-16 DIAGNOSIS — Z4789 Encounter for other orthopedic aftercare: Secondary | ICD-10-CM | POA: Diagnosis not present

## 2021-12-16 DIAGNOSIS — M25572 Pain in left ankle and joints of left foot: Secondary | ICD-10-CM | POA: Diagnosis not present

## 2021-12-23 DIAGNOSIS — Z4789 Encounter for other orthopedic aftercare: Secondary | ICD-10-CM | POA: Diagnosis not present

## 2021-12-23 DIAGNOSIS — M25572 Pain in left ankle and joints of left foot: Secondary | ICD-10-CM | POA: Diagnosis not present

## 2021-12-23 DIAGNOSIS — R531 Weakness: Secondary | ICD-10-CM | POA: Diagnosis not present

## 2021-12-23 DIAGNOSIS — R262 Difficulty in walking, not elsewhere classified: Secondary | ICD-10-CM | POA: Diagnosis not present

## 2021-12-30 DIAGNOSIS — R531 Weakness: Secondary | ICD-10-CM | POA: Diagnosis not present

## 2021-12-30 DIAGNOSIS — R262 Difficulty in walking, not elsewhere classified: Secondary | ICD-10-CM | POA: Diagnosis not present

## 2021-12-30 DIAGNOSIS — Z4789 Encounter for other orthopedic aftercare: Secondary | ICD-10-CM | POA: Diagnosis not present

## 2021-12-30 DIAGNOSIS — M25572 Pain in left ankle and joints of left foot: Secondary | ICD-10-CM | POA: Diagnosis not present

## 2021-12-31 ENCOUNTER — Ambulatory Visit (INDEPENDENT_AMBULATORY_CARE_PROVIDER_SITE_OTHER): Payer: BC Managed Care – PPO | Admitting: Podiatry

## 2021-12-31 DIAGNOSIS — M76822 Posterior tibial tendinitis, left leg: Secondary | ICD-10-CM | POA: Diagnosis not present

## 2021-12-31 DIAGNOSIS — L600 Ingrowing nail: Secondary | ICD-10-CM | POA: Diagnosis not present

## 2021-12-31 DIAGNOSIS — B351 Tinea unguium: Secondary | ICD-10-CM | POA: Diagnosis not present

## 2021-12-31 MED ORDER — CELECOXIB 100 MG PO CAPS: 100.0000 mg | ORAL_CAPSULE | Freq: Two times a day (BID) | ORAL | 0 refills | Status: DC | PRN

## 2022-01-01 ENCOUNTER — Encounter: Payer: Self-pay | Admitting: Podiatry

## 2022-01-01 NOTE — Telephone Encounter (Signed)
Please contact patient

## 2022-01-07 DIAGNOSIS — H43811 Vitreous degeneration, right eye: Secondary | ICD-10-CM | POA: Diagnosis not present

## 2022-01-07 DIAGNOSIS — H25813 Combined forms of age-related cataract, bilateral: Secondary | ICD-10-CM | POA: Diagnosis not present

## 2022-01-07 DIAGNOSIS — H43822 Vitreomacular adhesion, left eye: Secondary | ICD-10-CM | POA: Diagnosis not present

## 2022-01-07 DIAGNOSIS — H33103 Unspecified retinoschisis, bilateral: Secondary | ICD-10-CM | POA: Diagnosis not present

## 2022-01-08 ENCOUNTER — Encounter: Payer: Self-pay | Admitting: Podiatry

## 2022-01-08 ENCOUNTER — Ambulatory Visit: Payer: BC Managed Care – PPO | Admitting: Podiatry

## 2022-01-08 DIAGNOSIS — L601 Onycholysis: Secondary | ICD-10-CM | POA: Diagnosis not present

## 2022-01-08 DIAGNOSIS — L600 Ingrowing nail: Secondary | ICD-10-CM | POA: Diagnosis not present

## 2022-01-08 DIAGNOSIS — B351 Tinea unguium: Secondary | ICD-10-CM

## 2022-01-08 MED ORDER — TRAMADOL HCL 50 MG PO TABS
50.0000 mg | ORAL_TABLET | Freq: Three times a day (TID) | ORAL | 0 refills | Status: AC | PRN
Start: 2022-01-08 — End: 2022-01-13

## 2022-01-08 NOTE — Progress Notes (Signed)
Subjective: ?66 year old female presents the office today to help with drainage removed.  They have come in thick discolored, discomfort.  She tried topical medication without any significant improvement.  No swelling redness or drainage.  She has no other concerns. ? ?Objective: ?AAO x3, NAD ?DP/PT pulses palpable bilaterally, CRT less than 3 seconds ?Bilateral hallux nails are hypertrophic, dystrophic with yellow, brown discoloration is to tap to the nail distally and only attached along the proximal one third.  Ingrowing of the hallux nails bilaterally.  No edema, erythema or signs of infection today. ?No pain with calf compression, swelling, warmth, erythema ? ?Assessment: ?Onychodystrophy, ingrown toenails ? ?Plan: ?-All treatment options discussed with the patient including all alternatives, risks, complications.  ?-Today we discussed nail removal and she wants to proceed with this. ?-At this time, patient was to proceed with total nail removal without chemical matricectomy to the bilateral hallux due to dystrophy, ingrowing of the nails. Risks and complications were discussed with the patient for which they understand and  verbally consent to the procedure. Under sterile conditions a total of 3 mL of a mixture of 2% lidocaine plain and 0.5% Marcaine plain was infiltrated in a hallux block fashion. Once anesthetized, the skin was prepped in sterile fashion. A tourniquet was then applied.  Next bilateral hallux nail borders were removed in total without any complications.  To remove all nail border.  The nail was easily removed bilaterally.  No signs of infection.  Once the nail was removed, the area was debrided and the underlying skin was intact. The area was irrigated and hemostasis was obtained.  A dry sterile dressing was applied. After application of the dressing the tourniquet was removed and there is found to be an immediate capillary refill time to the digit. The patient tolerated the procedure well  any complications. Post procedure instructions were discussed the patient for which he verbally understood. Follow-up in one week for nail check or sooner if any problems are to arise. Discussed signs/symptoms of worsening infection and directed to call the office immediately should any occur or go directly to the emergency room. In the meantime, encouraged to call the office with any questions, concerns, changes symptoms. ?-Tramadol for pain as needed ?-Patient encouraged to call the office with any questions, concerns, change in symptoms.  ? ?Vivi Barrack DPM ? ?

## 2022-01-08 NOTE — Progress Notes (Signed)
Subjective: [name] is a 66 y.o. is seen today in office s/p left posterior tibial tendon repair preformed on 07/24/2021.  Since I last saw her she was recently discharged in physical therapy and she states that she is walking better and having less discomfort and swelling.  Overall she is doing much better.  Asking about having the toenails removed on both big toes as are thickened discolored.  No swelling or redness or any drainage that she reports.  She has no other concerns today. ? ?Objective: ?General: No acute distress, AAOx3 -presents with daughter present ?DP/PT pulses palpable 2/4, CRT < 3 sec to all digits.  ?Protective sensation intact. Motor function intact.  ?Left foot: Scar is formed.  Mild edema still present but there is no erythema or warmth.  No significant discomfort to palpation on the surgical site today.  No areas of pinpoint tenderness ?Bilateral hallux nails are hypertrophic, dystrophic, discolored with yellow, brown discoloration.  Ingrowing of the nails on both nail borders.  Nails are intact distally and only attached proximally. ?No pain with calf compression, swelling, warmth, erythema.  ? ? ?Assessment and Plan:  ?Status post left foot posterior tibial tendon repair, improving; onychodystrophy, ingrowing nails ? ?-Treatment options discussed including all alternatives, risks, and complications ?-At this time she is completed physical therapy.  Encouraged her to continue home physical therapy.  Continue with supportive shoe gear.  Ice, elevation as well as compression of any postoperative edema.  Will return to work as scheduled with restrictions for the first 2 weeks of working half days and use a cane as needed. ?-We will schedule for bilateral hallux nail removals ? ?Trula Slade DPM ?

## 2022-01-08 NOTE — Patient Instructions (Signed)

## 2022-01-10 DIAGNOSIS — H43813 Vitreous degeneration, bilateral: Secondary | ICD-10-CM | POA: Diagnosis not present

## 2022-01-10 DIAGNOSIS — H35342 Macular cyst, hole, or pseudohole, left eye: Secondary | ICD-10-CM | POA: Diagnosis not present

## 2022-01-10 DIAGNOSIS — H2513 Age-related nuclear cataract, bilateral: Secondary | ICD-10-CM | POA: Diagnosis not present

## 2022-01-13 DIAGNOSIS — H35342 Macular cyst, hole, or pseudohole, left eye: Secondary | ICD-10-CM | POA: Diagnosis not present

## 2022-01-14 DIAGNOSIS — H35342 Macular cyst, hole, or pseudohole, left eye: Secondary | ICD-10-CM | POA: Diagnosis not present

## 2022-01-16 ENCOUNTER — Other Ambulatory Visit: Payer: Self-pay | Admitting: Podiatry

## 2022-01-16 ENCOUNTER — Encounter: Payer: Self-pay | Admitting: Podiatry

## 2022-01-16 MED ORDER — CEPHALEXIN 500 MG PO CAPS: 500.0000 mg | ORAL_CAPSULE | Freq: Three times a day (TID) | ORAL | 0 refills | Status: DC

## 2022-01-16 NOTE — Telephone Encounter (Signed)
I called patient to go over her concerns.  She states that she has intermittent discomfort and she is having some mild pain at the base of the nail.  No drainage or pus.  She is soaking white vinegar last night which did better.  Continue soaking twice a day cover with antibiotic ointment and a bandage during the day but can leave the area open at nighttime.  Prescribed Keflex.  Any worsening or changes let us know. ?

## 2022-01-21 DIAGNOSIS — H35342 Macular cyst, hole, or pseudohole, left eye: Secondary | ICD-10-CM | POA: Diagnosis not present

## 2022-02-03 ENCOUNTER — Other Ambulatory Visit: Payer: Self-pay | Admitting: Podiatry

## 2022-02-11 ENCOUNTER — Ambulatory Visit: Payer: BC Managed Care – PPO | Admitting: Podiatry

## 2022-02-11 DIAGNOSIS — B351 Tinea unguium: Secondary | ICD-10-CM

## 2022-02-11 DIAGNOSIS — H35342 Macular cyst, hole, or pseudohole, left eye: Secondary | ICD-10-CM | POA: Diagnosis not present

## 2022-02-11 DIAGNOSIS — M76822 Posterior tibial tendinitis, left leg: Secondary | ICD-10-CM | POA: Diagnosis not present

## 2022-02-18 NOTE — Progress Notes (Signed)
Subjective: 66 year old female presents the office today for foot evaluation undergoing total nail avulsions to her hallux toenails.  She said that she has been doing well.  No significant pain.  No drainage or pus.  She will soak in Epsom salts.  Also regards to her ankle she has returned to work she is in a little bit of discomfort but overall been doing well.  No new concerns.  No recent injuries or changes otherwise.  Objective: AAO x3, NAD DP/PT pulses palpable bilaterally, CRT less than 3 seconds Status post total nail avulsions bilateral hallux nails with some scabbing present but there is no drainage or pus.  No tenderness palpation no surrounding erythema, ascending cellulitis. Regarding surgery is no significant tenderness to palpation over the course of the posterior tibial tendon on the surgical sites.  Minimal edema but there is no erythema or warmth.  No pain with ankle joint range of motion.  No other areas of discomfort. No pain with calf compression, swelling, warmth, erythema  Assessment: Status post total nail avulsions, healing well, posterior tibial tendon repair  Plan: -All treatment options discussed with the patient including all alternatives, risks, complications.  -Total sites appear to be healing well.  Continue washing with soap and water daily covering with antibiotic ointment and a bandage on the day but can leave the area open at nighttime.  We will watch the nail as it comes back and it starts to come in thick or discolored let me know we can start some different medications either oral or topical to try to help. -Regards to the surgery continue shoes with good arch support and home rehab exercises and icing particularly after work. -At this point she is doing well and was discharged from the postoperative care for both issues.  I encouraged her to call any questions or concerns or any changes and she verbalized understanding. -Patient encouraged to call the office  with any questions, concerns, change in symptoms.   Vivi Barrack DPM

## 2022-02-22 ENCOUNTER — Other Ambulatory Visit: Payer: Self-pay | Admitting: Podiatry

## 2022-03-12 DIAGNOSIS — I10 Essential (primary) hypertension: Secondary | ICD-10-CM | POA: Diagnosis not present

## 2022-03-12 DIAGNOSIS — E039 Hypothyroidism, unspecified: Secondary | ICD-10-CM | POA: Diagnosis not present

## 2022-03-12 DIAGNOSIS — G629 Polyneuropathy, unspecified: Secondary | ICD-10-CM | POA: Diagnosis not present

## 2022-03-12 DIAGNOSIS — E7800 Pure hypercholesterolemia, unspecified: Secondary | ICD-10-CM | POA: Diagnosis not present

## 2022-04-11 ENCOUNTER — Encounter: Payer: Self-pay | Admitting: Podiatry

## 2022-04-16 ENCOUNTER — Encounter (INDEPENDENT_AMBULATORY_CARE_PROVIDER_SITE_OTHER): Payer: Self-pay

## 2022-07-22 DIAGNOSIS — Z1231 Encounter for screening mammogram for malignant neoplasm of breast: Secondary | ICD-10-CM | POA: Diagnosis not present

## 2022-08-29 DIAGNOSIS — R051 Acute cough: Secondary | ICD-10-CM | POA: Diagnosis not present

## 2022-08-29 DIAGNOSIS — U071 COVID-19: Secondary | ICD-10-CM | POA: Diagnosis not present

## 2022-09-11 DIAGNOSIS — Z23 Encounter for immunization: Secondary | ICD-10-CM | POA: Diagnosis not present

## 2022-09-11 DIAGNOSIS — R42 Dizziness and giddiness: Secondary | ICD-10-CM | POA: Diagnosis not present

## 2022-09-11 DIAGNOSIS — R7303 Prediabetes: Secondary | ICD-10-CM | POA: Diagnosis not present

## 2022-09-11 DIAGNOSIS — Z0189 Encounter for other specified special examinations: Secondary | ICD-10-CM | POA: Diagnosis not present

## 2022-09-11 DIAGNOSIS — E7800 Pure hypercholesterolemia, unspecified: Secondary | ICD-10-CM | POA: Diagnosis not present

## 2022-09-11 DIAGNOSIS — I10 Essential (primary) hypertension: Secondary | ICD-10-CM | POA: Diagnosis not present

## 2022-09-11 DIAGNOSIS — E039 Hypothyroidism, unspecified: Secondary | ICD-10-CM | POA: Diagnosis not present

## 2022-09-23 DIAGNOSIS — Z13820 Encounter for screening for osteoporosis: Secondary | ICD-10-CM | POA: Diagnosis not present

## 2022-09-23 DIAGNOSIS — M810 Age-related osteoporosis without current pathological fracture: Secondary | ICD-10-CM | POA: Diagnosis not present

## 2022-10-17 DIAGNOSIS — E7800 Pure hypercholesterolemia, unspecified: Secondary | ICD-10-CM | POA: Diagnosis not present

## 2022-10-31 ENCOUNTER — Encounter: Payer: Self-pay | Admitting: Podiatry

## 2023-03-09 DIAGNOSIS — G629 Polyneuropathy, unspecified: Secondary | ICD-10-CM | POA: Diagnosis not present

## 2023-03-09 DIAGNOSIS — E039 Hypothyroidism, unspecified: Secondary | ICD-10-CM | POA: Diagnosis not present

## 2023-03-09 DIAGNOSIS — R7303 Prediabetes: Secondary | ICD-10-CM | POA: Diagnosis not present

## 2023-03-09 DIAGNOSIS — R350 Frequency of micturition: Secondary | ICD-10-CM | POA: Diagnosis not present

## 2023-03-09 DIAGNOSIS — I10 Essential (primary) hypertension: Secondary | ICD-10-CM | POA: Diagnosis not present

## 2023-04-11 ENCOUNTER — Other Ambulatory Visit: Payer: Self-pay

## 2023-04-11 ENCOUNTER — Encounter (HOSPITAL_BASED_OUTPATIENT_CLINIC_OR_DEPARTMENT_OTHER): Payer: Self-pay | Admitting: Emergency Medicine

## 2023-04-11 ENCOUNTER — Emergency Department (HOSPITAL_BASED_OUTPATIENT_CLINIC_OR_DEPARTMENT_OTHER): Payer: BC Managed Care – PPO

## 2023-04-11 ENCOUNTER — Emergency Department (HOSPITAL_BASED_OUTPATIENT_CLINIC_OR_DEPARTMENT_OTHER)
Admission: EM | Admit: 2023-04-11 | Discharge: 2023-04-11 | Disposition: A | Discharge status: Home / Self Care | Payer: BC Managed Care – PPO | Source: Home / Self Care | Attending: Emergency Medicine | Admitting: Emergency Medicine

## 2023-04-11 DIAGNOSIS — R109 Unspecified abdominal pain: Secondary | ICD-10-CM | POA: Diagnosis not present

## 2023-04-11 DIAGNOSIS — K828 Other specified diseases of gallbladder: Secondary | ICD-10-CM

## 2023-04-11 DIAGNOSIS — Z79899 Other long term (current) drug therapy: Secondary | ICD-10-CM | POA: Insufficient documentation

## 2023-04-11 DIAGNOSIS — K8050 Calculus of bile duct without cholangitis or cholecystitis without obstruction: Secondary | ICD-10-CM | POA: Diagnosis not present

## 2023-04-11 DIAGNOSIS — R1011 Right upper quadrant pain: Secondary | ICD-10-CM | POA: Diagnosis not present

## 2023-04-11 DIAGNOSIS — K810 Acute cholecystitis: Secondary | ICD-10-CM | POA: Diagnosis not present

## 2023-04-11 DIAGNOSIS — K838 Other specified diseases of biliary tract: Secondary | ICD-10-CM | POA: Diagnosis not present

## 2023-04-11 DIAGNOSIS — D696 Thrombocytopenia, unspecified: Secondary | ICD-10-CM | POA: Insufficient documentation

## 2023-04-11 DIAGNOSIS — I10 Essential (primary) hypertension: Secondary | ICD-10-CM | POA: Diagnosis not present

## 2023-04-11 LAB — CBC
HCT: 39.2 % (ref 36.0–46.0)
Hemoglobin: 12 g/dL (ref 12.0–15.0)
MCH: 22.1 pg — ABNORMAL LOW (ref 26.0–34.0)
MCHC: 30.6 g/dL (ref 30.0–36.0)
MCV: 72.1 fL — ABNORMAL LOW (ref 80.0–100.0)
Platelets: 132 10*3/uL — ABNORMAL LOW (ref 150–400)
RBC: 5.44 MIL/uL — ABNORMAL HIGH (ref 3.87–5.11)
RDW: 16.1 % — ABNORMAL HIGH (ref 11.5–15.5)
WBC: 10.4 10*3/uL (ref 4.0–10.5)
nRBC: 0 % (ref 0.0–0.2)

## 2023-04-11 LAB — URINALYSIS, ROUTINE W REFLEX MICROSCOPIC
Bilirubin Urine: NEGATIVE
Glucose, UA: NEGATIVE mg/dL
Hgb urine dipstick: NEGATIVE
Ketones, ur: NEGATIVE mg/dL
Leukocytes,Ua: NEGATIVE
Nitrite: NEGATIVE
Protein, ur: NEGATIVE mg/dL
Specific Gravity, Urine: 1.01 (ref 1.005–1.030)
pH: 7.5 (ref 5.0–8.0)

## 2023-04-11 LAB — TROPONIN I (HIGH SENSITIVITY): Troponin I (High Sensitivity): 2 ng/L (ref <18)

## 2023-04-11 LAB — COMPREHENSIVE METABOLIC PANEL
ALT: 13 U/L (ref 0–44)
AST: 23 U/L (ref 15–41)
Albumin: 3.9 g/dL (ref 3.5–5.0)
Alkaline Phosphatase: 72 U/L (ref 38–126)
Anion gap: 7 (ref 5–15)
BUN: 12 mg/dL (ref 8–23)
CO2: 29 mmol/L (ref 22–32)
Calcium: 8.9 mg/dL (ref 8.9–10.3)
Chloride: 106 mmol/L (ref 98–111)
Creatinine, Ser: 1.04 mg/dL — ABNORMAL HIGH (ref 0.44–1.00)
GFR, Estimated: 59 mL/min — ABNORMAL LOW (ref >60)
Glucose, Bld: 96 mg/dL (ref 70–99)
Potassium: 3.9 mmol/L (ref 3.5–5.1)
Sodium: 142 mmol/L (ref 135–145)
Total Bilirubin: 0.5 mg/dL (ref 0.3–1.2)
Total Protein: 6.6 g/dL (ref 6.5–8.1)

## 2023-04-11 LAB — LIPASE, BLOOD: Lipase: 53 U/L — ABNORMAL HIGH (ref 11–51)

## 2023-04-11 MED ORDER — ALUM & MAG HYDROXIDE-SIMETH 200-200-20 MG/5ML PO SUSP
30.0000 mL | Freq: Once | ORAL | Status: AC
  Administered 2023-04-11: 30 mL via ORAL
  Filled 2023-04-11: qty 30

## 2023-04-11 MED ORDER — DICYCLOMINE HCL 20 MG PO TABS: 20.0000 mg | ORAL_TABLET | Freq: Two times a day (BID) | ORAL | 0 refills | Status: AC | PRN

## 2023-04-11 MED ORDER — OXYCODONE-ACETAMINOPHEN 5-325 MG PO TABS: 1.0000 | ORAL_TABLET | Freq: Four times a day (QID) | ORAL | 0 refills | Status: DC | PRN

## 2023-04-11 MED ORDER — LIDOCAINE VISCOUS HCL 2 % MT SOLN
15.0000 mL | Freq: Once | OROMUCOSAL | Status: AC
  Administered 2023-04-11: 15 mL via ORAL
  Filled 2023-04-11: qty 15

## 2023-04-11 MED ORDER — MORPHINE SULFATE (PF) 4 MG/ML IV SOLN
4.0000 mg | Freq: Once | INTRAVENOUS | Status: AC
  Administered 2023-04-11: 4 mg via INTRAVENOUS
  Filled 2023-04-11: qty 1

## 2023-04-11 MED ORDER — ONDANSETRON HCL 4 MG PO TABS: 4.0000 mg | ORAL_TABLET | Freq: Four times a day (QID) | ORAL | 0 refills | Status: AC | PRN

## 2023-04-11 NOTE — Discharge Instructions (Addendum)
Please use Tylenol or ibuprofen for pain.  You may use 600 mg ibuprofen every 6 hours or 1000 mg of Tylenol every 6 hours.  You may choose to alternate between the 2.  This would be most effective.  Not to exceed 4 g of Tylenol within 24 hours.  Not to exceed 3200 mg ibuprofen 24 hours.  You can use the Bentyl medication which is nonnarcotic up to twice daily for abdominal cramping and pain.  If you are having severe pain that is not responding to any of the above you can take one of the stronger narcotic pain medications I have given you.  Have also prescribed some nausea medication to use as needed.  Please call the surgeon whose contact information I have provided first thing on Monday morning to let them know that you have biliary colic and gallbladder sludge and want to schedule a consultation for further evaluation and possible gallbladder removal.

## 2023-04-11 NOTE — ED Triage Notes (Signed)
Today, started with abdominal pain that radiated all across her torso. Thought It may be gas, took gasx with no relief. States when she lies down abdomen looks more swollen. Lbm today, normal

## 2023-04-11 NOTE — ED Provider Notes (Signed)
[place] EMERGENCY DEPARTMENT AT [facility] Provider Note   CSN: [id number] Arrival date & time: 04/11/23  1610     History  Chief Complaint  Patient presents with   Abdominal Pain    Jocelyn White is a 67 y.o. female with past medical history significant for hypertension, obesity, acid reflux, hypertension who presents concern for abdominal pain that started just 2 hours prior to arrival.  Patient reports that it initially started in the left shoulder, radiated to the right shoulder, and then towards her abdomen.  She denies any chest pain, shortness of breath, nausea, vomiting, diarrhea, constipation.  She rates the pain 9/10, constant in nature.  She reports that she feels "unsettled".  She denies any previous intra-abdominal surgeries.  She denies any recent change in diet.  She denies any burning sensation in the throat.   Abdominal Pain      Home Medications Prior to Admission medications   Medication Sig Start Date End Date Taking? Authorizing Provider  dicyclomine (BENTYL) 20 MG tablet Take 1 tablet (20 mg total) by mouth 2 (two) times daily as needed for spasms. 04/11/23  Yes Ollivander See H, PA-C  ondansetron (ZOFRAN) 4 MG tablet Take 1 tablet (4 mg total) by mouth every 6 (six) hours as needed for nausea or vomiting. 04/11/23  Yes Sahory Nordling H, PA-C  oxyCODONE-acetaminophen (PERCOCET/ROXICET) 5-325 MG tablet Take 1 tablet by mouth every 6 (six) hours as needed for severe pain. 04/11/23  Yes Anamae Rochelle H, PA-C  albuterol (VENTOLIN HFA) 108 (90 Base) MCG/ACT inhaler Inhale into the lungs. 09/27/20   [provider]  celecoxib (CELEBREX) 100 MG capsule TAKE 1 CAPSULE BY MOUTH 2 TIMES DAILY AS NEEDED. 02/23/22   Vivi Barrack, DPM  cephALEXin (KEFLEX) 500 MG capsule Take 1 capsule (500 mg total) by mouth 3 (three) times daily. 01/16/22   Vivi Barrack, DPM  ciclopirox (PENLAC) 8 % solution APPLY TOPICALLY AT BEDTIME. APPLY  OVER NAIL AND SURROUNDING SKIN. APPLY DAILY OVER PREVIOUS COAT. AFTER SEVEN (7) DAYS, MAY REMOVE WITH ALCOHOL AND CONTINUE CYCLE. 09/23/21   Vivi Barrack, DPM  diclofenac Sodium (VOLTAREN) 1 % GEL Apply 2 g topically 4 (four) times daily. Rub into affected area of foot 2 to 4 times daily 04/02/20   Vivi Barrack, DPM  enoxaparin (LOVENOX) 40 MG/0.4ML injection Inject 0.4 mLs (40 mg total) into the skin daily for 14 days. 07/24/21 08/07/21  Vivi Barrack, DPM  furosemide (LASIX) 40 MG tablet Take 40 mg by mouth daily as needed. 12/19/20   [provider]  gabapentin (NEURONTIN) 100 MG capsule Take 200 mg by mouth 2 (two) times daily. 02/27/21   [provider]  ketorolac (TORADOL) 60 MG/2ML SOLN injection Inject 60 mg into the muscle once. 10/17/20   [provider]  levothyroxine (SYNTHROID) 125 MCG tablet Take 125 mcg by mouth every morning. 02/27/21   [provider]  levothyroxine (SYNTHROID, LEVOTHROID) 175 MCG tablet Take 175 mcg by mouth daily. 01/19/15   [provider]  losartan (COZAAR) 50 MG tablet Take 50 mg by mouth daily. 11/23/20   [provider]  LOSARTAN POTASSIUM PO Take by mouth.    [provider]  meclizine (ANTIVERT) 25 MG tablet Take 1 tablet (25 mg total) by mouth 3 (three) times daily as needed for dizziness. 02/01/17   Trixie Dredge, PA-C  meloxicam (MOBIC) 15 MG tablet Take 15 mg by mouth daily as needed. 10/27/20   [provider]  meloxicam (MOBIC) 7.5 MG tablet Take 1 tablet (7.5 mg total) by mouth daily. 12/03/17   Vivi Barrack, DPM  methylPREDNISolone (MEDROL DOSEPAK) 4 MG TBPK tablet Take as directed 04/16/21   Vivi Barrack, DPM  mupirocin ointment (BACTROBAN) 2 % Apply 1 application topically 2 (two) times daily. 09/10/21   Vivi Barrack, DPM  nitroGLYCERIN (NITRO-DUR) 0.2 mg/hr patch Apply to ankle daily as needed 05/16/21   Vivi Barrack, DPM  omeprazole (PRILOSEC) 40 MG  capsule Take 40 mg by mouth daily.    [provider]  promethazine (PHENERGAN) 25 MG tablet Take 1 tablet (25 mg total) by mouth every 8 (eight) hours as needed for nausea or vomiting. 07/24/21   Vivi Barrack, DPM  terbinafine (LAMISIL) 250 MG tablet Take 1 tablet (250 mg total) by mouth daily. 02/08/21   Vivi Barrack, DPM  triamcinolone acetonide [facility]) 40 MG/ML injection SMARTSIG:2 Milliliter(s) SUB-Q Once 10/17/20   [provider]      Allergies    Lisinopril and Nabumetone    Review of Systems   Review of Systems  Gastrointestinal:  Positive for abdominal pain.  All other systems reviewed and are negative.   Physical Exam Updated Vital Signs BP (!) 135/93 (BP Location: Right Arm)   Pulse 72   Temp 98.3 F (36.8 C)   Resp 18   SpO2 100%  Physical Exam Vitals and nursing note reviewed.  Constitutional:      General: She is not in acute distress.    Appearance: Normal appearance.  HENT:     Head: Normocephalic and atraumatic.  Eyes:     General:        Right eye: No discharge.        Left eye: No discharge.  Cardiovascular:     Rate and Rhythm: Normal rate and regular rhythm.     Heart sounds: No murmur heard.    No friction rub. No gallop.  Pulmonary:     Effort: Pulmonary effort is normal.     Breath sounds: Normal breath sounds.  Abdominal:     General: Bowel sounds are normal.     Palpations: Abdomen is soft.     Comments: Patient with tenderness most focally in epigastric region and right upper quadrant region, questionable murphy sign  Skin:    General: Skin is warm and dry.     Capillary Refill: Capillary refill takes less than 2 seconds.  Neurological:     Mental Status: She is alert and oriented to person, place, and time.  Psychiatric:        Mood and Affect: Mood normal.        Behavior: Behavior normal.     ED Results / Procedures / Treatments   Labs (all labs ordered are listed, but only abnormal results are  displayed) Labs Reviewed  LIPASE, BLOOD - Abnormal; Notable for the following components:      Result Value   Lipase 53 (*)    All other components within normal limits  COMPREHENSIVE METABOLIC PANEL - Abnormal; Notable for the following components:   Creatinine, Ser 1.04 (*)    GFR, Estimated 59 (*)    All other components within normal limits  CBC - Abnormal; Notable for the following components:   RBC 5.44 (*)    MCV 72.1 (*)    MCH 22.1 (*)    RDW 16.1 (*)    Platelets 132 (*)    All other  components within normal limits  URINALYSIS, ROUTINE W REFLEX MICROSCOPIC - Abnormal; Notable for the following components:   Color, Urine COLORLESS (*)    All other components within normal limits  TROPONIN I (HIGH SENSITIVITY)    EKG None  Radiology US Abdomen Limited RUQ (LIVER/GB)  Result Date: 04/11/2023 CLINICAL DATA:  Right upper quadrant pain EXAM: ULTRASOUND ABDOMEN LIMITED RIGHT UPPER QUADRANT COMPARISON:  None Available. FINDINGS: Gallbladder: No gallstones or gallbladder wall thickening. Gallbladder sludge is noted. Trace pericholecystic fluid noted. A positive sonographic Murphy's sign was reported by the sonographer. Common bile duct: Diameter: 2 mm Liver: No focal lesion identified. Within normal limits in parenchymal echogenicity. Portal vein is patent on color Doppler imaging with normal direction of blood flow towards the liver. Other: None. IMPRESSION: Gallbladder sludge with trace pericholecystic fluid and a positive sonographic Murphy's sign. No gallstones or gallbladder wall thickening. Findings are equivocal for acute cholecystitis. If there is high none clinical concern for acute cholecystitis, consider further evaluation with a nuclear medicine hepatobiliary scan. Electronically Signed   By: Signa Kell M.D.   On: 04/11/2023 17:47    Procedures Procedures    Medications Ordered in ED Medications  morphine (PF) 4 MG/ML injection 4 mg (4 mg Intravenous Given 04/11/23  1659)  alum & mag hydroxide-simeth (MAALOX/MYLANTA) 200-200-20 MG/5ML suspension 30 mL (30 mLs Oral Given 04/11/23 1659)    And  lidocaine (XYLOCAINE) 2 % viscous mouth solution 15 mL (15 mLs Oral Given 04/11/23 1659)    ED Course/ Medical Decision Making/ A&P                                 Medical Decision Making Amount and/or Complexity of Data Reviewed Labs: ordered. Radiology: ordered.  Risk OTC drugs. Prescription drug management.   This patient is a 67 y.o. female  who presents to the ED for concern of epigastric to RUQ abdominal pain.   Differential diagnoses prior to evaluation: The emergent differential diagnosis includes, but is not limited to,  esophagitis, gastritis, peptic ulcer disease, esophageal rupture, gastric rupture, Boerhaave's, Mallory-Weiss, pancreatitis, cholecystitis, cholangitis, acute mesenteric ischemia, atypical chest pain or ACS, lower lobar pneumonia versus other . This is not an exhaustive differential.   Past Medical History / Co-morbidities / Social History: HTN, obesity  Physical Exam: Physical exam performed. The pertinent findings include: Patient with tenderness most focally in epigastric region and right upper quadrant region, questionable murphy sign   Lab Tests/Imaging studies: I personally interpreted labs/imaging and the pertinent results include: CBC unremarkable mild microcytic quality of hemoglobin without anemia, mild thrombocytopenia, platelets 132.  Minimally elevated lipase at 53 not greater than 3 times the upper limit of normal, UA unremarkable.  CMP unremarkable.  Troponin normal. I independently interpreted right upper quadrant ultrasound which shows some gallbladder sludge, no gallstones, no bile duct dilation, no gallbladder wall thickening.  I agree with the radiologist interpretation.   Medications: I ordered medication including morphine, GI cocktail.  I have reviewed the patients home medicines and have made adjustments as  needed.   Disposition: After consideration of the diagnostic results and the patients response to treatment, I feel that patient with symptoms of gallbladder sludge, biliary colic, she is afebrile with no white count, and no significant liver enzyme abnormalities, will discharge with pain medication, nausea medication, and close surgery follow-up, discussed extensive return precautions for worsening pain, patient instructed to go to one of our  facilities that have surgical capabilities if pain returns.   emergency department workup does not suggest an emergent condition requiring admission or immediate intervention beyond what has been performed at this time. The plan is: as above -- follow up with surgery, gallbladder eating plan discussed, prescribed pain and nausea medication in short term. The patient is safe for discharge and has been instructed to return immediately for worsening symptoms, change in symptoms or any other concerns.  Final Clinical Impression(s) / ED Diagnoses Final diagnoses:  Biliary colic  Gallbladder sludge    Rx / DC Orders ED Discharge Orders          Ordered    dicyclomine (BENTYL) 20 MG tablet  2 times daily PRN        04/11/23 1814    ondansetron (ZOFRAN) 4 MG tablet  Every 6 hours PRN        04/11/23 1814    oxyCODONE-acetaminophen (PERCOCET/ROXICET) 5-325 MG tablet  Every 6 hours PRN        04/11/23 1814              Haiden Clucas, Edyth Gunnels 04/11/23 1826    Benjiman Core, MD 04/11/23 2304

## 2023-05-06 DIAGNOSIS — K8050 Calculus of bile duct without cholangitis or cholecystitis without obstruction: Secondary | ICD-10-CM | POA: Diagnosis not present

## 2023-05-25 ENCOUNTER — Ambulatory Visit: Payer: Self-pay | Admitting: General Surgery

## 2023-05-25 NOTE — Pre-Procedure Instructions (Signed)
Surgical Instructions   Your procedure is scheduled on June 01, 2023. Report to [facility] Main Entrance "A" at 7:30 A.M., then check in with the Admitting office. Any questions or running late day of surgery: call [phone]  Questions prior to your surgery date: call [phone], Monday-Friday, 8am-4pm. If you experience any cold or flu symptoms such as cough, fever, chills, shortness of breath, etc. between now and your scheduled surgery, please notify us at the above number.     Remember:  Do not eat after midnight the night before your surgery   You may drink clear liquids until 6:30 AM the morning of your surgery.   Clear liquids allowed are: Water, Non-Citrus Juices (without pulp), Carbonated Beverages, Clear Tea, Black Coffee Only (NO MILK, CREAM OR POWDERED CREAMER of any kind), and Gatorade.    Take these medicines the morning of surgery with A SIP OF WATER: gabapentin (NEURONTIN)  levothyroxine (SYNTHROID)  omeprazole (PRILOSEC)    May take these medicines IF NEEDED: albuterol (VENTOLIN HFA) inhaler  dicyclomine (BENTYL)  meclizine (ANTIVERT)  ondansetron (ZOFRAN)  oxyCODONE-acetaminophen (PERCOCET/ROXICET)  Polyethyl Glycol-Propyl Glycol (SYSTANE) eye drops rosuvastatin (CRESTOR)    One week prior to surgery, STOP taking any Aspirin (unless otherwise instructed by your surgeon) Aleve, Naproxen, Ibuprofen, Motrin, Advil, Goody's, BC's, all herbal medications, fish oil, and non-prescription vitamins.                     Do NOT Smoke (Tobacco/Vaping) for 24 hours prior to your procedure.  If you use a CPAP at night, you may bring your mask/headgear for your overnight stay.   You will be asked to remove any contacts, glasses, piercing's, hearing aid's, dentures/partials prior to surgery. Please bring cases for these items if needed.    Patients discharged the day of surgery will not be allowed to drive home, and someone needs to stay with them for 24  hours.  SURGICAL WAITING ROOM VISITATION Patients may have no more than 2 support people in the waiting area - these visitors may rotate.   Pre-op nurse will coordinate an appropriate time for 1 ADULT support person, who may not rotate, to accompany patient in pre-op.  Children under the age of 75 must have an adult with them who is not the patient and must remain in the main waiting area with an adult.  If the patient needs to stay at the hospital during part of their recovery, the visitor guidelines for inpatient rooms apply.  Please refer to the [facility] website for the visitor guidelines for any additional information.   If you received a COVID test during your pre-op visit  it is requested that you wear a mask when out in public, stay away from anyone that may not be feeling well and notify your surgeon if you develop symptoms. If you have been in contact with anyone that has tested positive in the last 10 days please notify you surgeon.      Pre-operative CHG Bathing Instructions   You can play a key role in reducing the risk of infection after surgery. Your skin needs to be as free of germs as possible. You can reduce the number of germs on your skin by washing with CHG (chlorhexidine gluconate) soap before surgery. CHG is an antiseptic soap that kills germs and continues to kill germs even after washing.   DO NOT use if you have an allergy to chlorhexidine/CHG or antibacterial soaps. If your skin becomes reddened or  irritated, stop using the CHG and notify one of our RNs at [phone].              TAKE A SHOWER THE NIGHT BEFORE SURGERY AND THE DAY OF SURGERY    Please keep in mind the following:  DO NOT shave, including legs and underarms, 48 hours prior to surgery.   You may shave your face before/day of surgery.  Place clean sheets on your bed the night before surgery Use a clean washcloth (not used since being washed) for each shower. DO NOT sleep with pet's night  before surgery.  CHG Shower Instructions:  If you choose to wash your hair and private area, wash first with your normal shampoo/soap.  After you use shampoo/soap, rinse your hair and body thoroughly to remove shampoo/soap residue.  Turn the water OFF and apply half the bottle of CHG soap to a CLEAN washcloth.  Apply CHG soap ONLY FROM YOUR NECK DOWN TO YOUR TOES (washing for 3-5 minutes)  DO NOT use CHG soap on face, private areas, open wounds, or sores.  Pay special attention to the area where your surgery is being performed.  If you are having back surgery, having someone wash your back for you may be helpful. Wait 2 minutes after CHG soap is applied, then you may rinse off the CHG soap.  Pat dry with a clean towel  Put on clean pajamas    Additional instructions for the day of surgery: DO NOT APPLY any lotions, deodorants, cologne, or perfumes.   Do not wear jewelry or makeup Do not wear nail polish, gel polish, artificial nails, or any other type of covering on natural nails (fingers and toes) Do not bring valuables to the hospital. [facility] is not responsible for valuables/personal belongings. Put on clean/comfortable clothes.  Please brush your teeth.  Ask your nurse before applying any prescription medications to the skin.

## 2023-05-26 ENCOUNTER — Other Ambulatory Visit: Payer: Self-pay

## 2023-05-26 ENCOUNTER — Encounter (HOSPITAL_COMMUNITY): Payer: Self-pay

## 2023-05-26 ENCOUNTER — Encounter (HOSPITAL_COMMUNITY)
Admission: RE | Admit: 2023-05-26 | Discharge: 2023-05-26 | Disposition: A | Discharge status: Home / Self Care | Payer: BC Managed Care – PPO | Source: Ambulatory Visit | Attending: General Surgery

## 2023-05-26 VITALS — BP 134/82 | HR 82 | Temp 98.4°F | Resp 18 | Ht 63.0 in | Wt 241.9 lb

## 2023-05-26 DIAGNOSIS — Z01812 Encounter for preprocedural laboratory examination: Secondary | ICD-10-CM | POA: Diagnosis not present

## 2023-05-26 DIAGNOSIS — I10 Essential (primary) hypertension: Secondary | ICD-10-CM | POA: Diagnosis not present

## 2023-05-26 HISTORY — DX: Hypothyroidism, unspecified: E03.9

## 2023-05-26 HISTORY — DX: Unspecified osteoarthritis, unspecified site: M19.90

## 2023-05-26 LAB — CBC
HCT: 39.6 % (ref 36.0–46.0)
Hemoglobin: 11.9 g/dL — ABNORMAL LOW (ref 12.0–15.0)
MCH: 22.2 pg — ABNORMAL LOW (ref 26.0–34.0)
MCHC: 30.1 g/dL (ref 30.0–36.0)
MCV: 74 fL — ABNORMAL LOW (ref 80.0–100.0)
Platelets: 122 10*3/uL — ABNORMAL LOW (ref 150–400)
RBC: 5.35 MIL/uL — ABNORMAL HIGH (ref 3.87–5.11)
RDW: 16.3 % — ABNORMAL HIGH (ref 11.5–15.5)
WBC: 7.6 10*3/uL (ref 4.0–10.5)
nRBC: 0 % (ref 0.0–0.2)

## 2023-05-26 LAB — COMPREHENSIVE METABOLIC PANEL
ALT: 12 U/L (ref 0–44)
AST: 17 U/L (ref 15–41)
Albumin: 3.6 g/dL (ref 3.5–5.0)
Alkaline Phosphatase: 66 U/L (ref 38–126)
Anion gap: 11 (ref 5–15)
BUN: 13 mg/dL (ref 8–23)
CO2: 27 mmol/L (ref 22–32)
Calcium: 8.8 mg/dL — ABNORMAL LOW (ref 8.9–10.3)
Chloride: 105 mmol/L (ref 98–111)
Creatinine, Ser: 0.92 mg/dL (ref 0.44–1.00)
GFR, Estimated: >60 mL/min (ref >60)
Glucose, Bld: 95 mg/dL (ref 70–99)
Potassium: 3.9 mmol/L (ref 3.5–5.1)
Sodium: 143 mmol/L (ref 135–145)
Total Bilirubin: 0.6 mg/dL (ref 0.3–1.2)
Total Protein: 6.7 g/dL (ref 6.5–8.1)

## 2023-05-26 NOTE — Progress Notes (Signed)
PCP - Irven Coe, MD Cardiologist - denies  PPM/ICD - denies Device Orders - n/a Rep Notified - n/a  Chest x-ray - denies EKG - 04/11/2023 Stress Test - 04/30/2014 (pt cannot remember the reason for it) ECHO - denies Cardiac Cath - denies  Sleep Study - denies CPAP - n/a  Fasting Blood Sugar - no DM Checks Blood Sugar _____ times a day  Last dose of GLP1 agonist-  n/a GLP1 instructions: n/a  Blood Thinner Instructions: n/a Aspirin Instructions: n/a  ERAS Protcol - yes, till 0630 PRE-SURGERY Ensure or G2- no  COVID TEST- n/a   Anesthesia review: no  Patient denies shortness of breath, fever, cough and chest pain at PAT appointment   All instructions explained to the patient, with a verbal understanding of the material. Patient agrees to go over the instructions while at home for a better understanding. Patient also instructed to self quarantine after being tested for COVID-19. The opportunity to ask questions was provided.

## 2023-05-30 ENCOUNTER — Encounter (HOSPITAL_COMMUNITY): Payer: Self-pay | Admitting: General Surgery

## 2023-05-30 NOTE — Anesthesia Preprocedure Evaluation (Signed)
Anesthesia Evaluation  Patient identified by MRN, date of birth, ID band Patient awake    Reviewed: Allergy & Precautions, NPO status , Patient's Chart, lab work & pertinent test results  Airway Mallampati: I  TM Distance: >3 FB Neck ROM: Full    Dental  (+) Edentulous Upper, Edentulous Lower   Pulmonary former smoker   breath sounds clear to auscultation       Cardiovascular hypertension, Pt. on medications  Rhythm:Regular Rate:Normal     Neuro/Psych negative neurological ROS  negative psych ROS   GI/Hepatic Neg liver ROS,GERD  Medicated,,  Endo/Other  Hypothyroidism    Renal/GU negative Renal ROS     Musculoskeletal  (+) Arthritis ,    Abdominal   Peds  Hematology negative hematology ROS (+)   Anesthesia Other Findings   Reproductive/Obstetrics                             Anesthesia Physical Anesthesia Plan  ASA: 3  Anesthesia Plan: General   Post-op Pain Management: Tylenol PO (pre-op)* and Toradol IV (intra-op)*   Induction: Intravenous  PONV Risk Score and Plan: 4 or greater and Ondansetron, Dexamethasone, Midazolam and Scopolamine patch - Pre-op  Airway Management Planned: Oral ETT  Additional Equipment: None  Intra-op Plan:   Post-operative Plan: Extubation in OR  Informed Consent: I have reviewed the patients History and Physical, chart, labs and discussed the procedure including the risks, benefits and alternatives for the proposed anesthesia with the patient or authorized representative who has indicated his/her understanding and acceptance.     Dental advisory given  Plan Discussed with:   Anesthesia Plan Comments:        Anesthesia Quick Evaluation

## 2023-06-01 ENCOUNTER — Ambulatory Visit (HOSPITAL_COMMUNITY): Payer: Self-pay | Admitting: Anesthesiology

## 2023-06-01 ENCOUNTER — Other Ambulatory Visit: Payer: Self-pay

## 2023-06-01 ENCOUNTER — Encounter (HOSPITAL_COMMUNITY)
Admission: RE | Disposition: A | Discharge status: Home / Self Care | Payer: Self-pay | Source: Home / Self Care | Attending: General Surgery

## 2023-06-01 ENCOUNTER — Encounter (HOSPITAL_COMMUNITY): Payer: Self-pay | Admitting: General Surgery

## 2023-06-01 ENCOUNTER — Ambulatory Visit (HOSPITAL_COMMUNITY)
Admission: RE | Admit: 2023-06-01 | Discharge: 2023-06-01 | Disposition: A | Discharge status: Home / Self Care | Payer: BC Managed Care – PPO | Attending: General Surgery | Admitting: General Surgery

## 2023-06-01 ENCOUNTER — Other Ambulatory Visit (HOSPITAL_COMMUNITY): Payer: Self-pay

## 2023-06-01 DIAGNOSIS — K8010 Calculus of gallbladder with chronic cholecystitis without obstruction: Secondary | ICD-10-CM | POA: Diagnosis not present

## 2023-06-01 DIAGNOSIS — I10 Essential (primary) hypertension: Secondary | ICD-10-CM | POA: Diagnosis not present

## 2023-06-01 DIAGNOSIS — Z79899 Other long term (current) drug therapy: Secondary | ICD-10-CM | POA: Diagnosis not present

## 2023-06-01 DIAGNOSIS — K219 Gastro-esophageal reflux disease without esophagitis: Secondary | ICD-10-CM | POA: Diagnosis not present

## 2023-06-01 DIAGNOSIS — K8050 Calculus of bile duct without cholangitis or cholecystitis without obstruction: Secondary | ICD-10-CM | POA: Diagnosis not present

## 2023-06-01 DIAGNOSIS — Z87891 Personal history of nicotine dependence: Secondary | ICD-10-CM | POA: Diagnosis not present

## 2023-06-01 DIAGNOSIS — E039 Hypothyroidism, unspecified: Secondary | ICD-10-CM | POA: Insufficient documentation

## 2023-06-01 HISTORY — PX: CHOLECYSTECTOMY: SHX55

## 2023-06-01 SURGERY — LAPAROSCOPIC CHOLECYSTECTOMY
Anesthesia: General | Site: Abdomen

## 2023-06-01 MED ORDER — DEXAMETHASONE SODIUM PHOSPHATE 10 MG/ML IJ SOLN
INTRAMUSCULAR | Status: AC
  Filled 2023-06-01: qty 1

## 2023-06-01 MED ORDER — CHLORHEXIDINE GLUCONATE 0.12 % MT SOLN: 15.0000 mL | Freq: Once | OROMUCOSAL | Status: AC

## 2023-06-01 MED ORDER — ACETAMINOPHEN 160 MG/5ML PO SOLN: 325.0000 mg | ORAL | Status: DC | PRN

## 2023-06-01 MED ORDER — PROPOFOL 10 MG/ML IV BOLUS
INTRAVENOUS | Status: AC
  Filled 2023-06-01: qty 20

## 2023-06-01 MED ORDER — FENTANYL CITRATE (PF) 100 MCG/2ML IJ SOLN
INTRAMUSCULAR | Status: AC
  Filled 2023-06-01: qty 2

## 2023-06-01 MED ORDER — ACETAMINOPHEN 325 MG PO TABS: 325.0000 mg | ORAL_TABLET | ORAL | Status: DC | PRN

## 2023-06-01 MED ORDER — LACTATED RINGERS IV SOLN: INTRAVENOUS | Status: DC

## 2023-06-01 MED ORDER — OXYCODONE HCL 5 MG/5ML PO SOLN
5.0000 mg | Freq: Once | ORAL | Status: AC | PRN
  Administered 2023-06-01: 5 mg via ORAL

## 2023-06-01 MED ORDER — OXYCODONE HCL 5 MG PO TABS
5.0000 mg | ORAL_TABLET | Freq: Four times a day (QID) | ORAL | 0 refills | Status: AC | PRN
  Filled 2023-06-01: qty 20, 5d supply, fill #0

## 2023-06-01 MED ORDER — OXYCODONE HCL 5 MG/5ML PO SOLN
ORAL | Status: AC
  Filled 2023-06-01: qty 5

## 2023-06-01 MED ORDER — BUPIVACAINE-EPINEPHRINE 0.25% -1:200000 IJ SOLN
INTRAMUSCULAR | Status: DC | PRN
  Administered 2023-06-01: 22 mL

## 2023-06-01 MED ORDER — SODIUM CHLORIDE 0.9 % IR SOLN
Status: DC | PRN
  Administered 2023-06-01: 1000 mL

## 2023-06-01 MED ORDER — CEFAZOLIN SODIUM-DEXTROSE 2-4 GM/100ML-% IV SOLN
2.0000 g | INTRAVENOUS | Status: AC
  Administered 2023-06-01: 2 g via INTRAVENOUS

## 2023-06-01 MED ORDER — GABAPENTIN 300 MG PO CAPS
ORAL_CAPSULE | ORAL | Status: AC
  Filled 2023-06-01: qty 1

## 2023-06-01 MED ORDER — MIDAZOLAM HCL 2 MG/2ML IJ SOLN
INTRAMUSCULAR | Status: AC
  Filled 2023-06-01: qty 2

## 2023-06-01 MED ORDER — BUPIVACAINE-EPINEPHRINE (PF) 0.25% -1:200000 IJ SOLN
INTRAMUSCULAR | Status: AC
  Filled 2023-06-01: qty 30

## 2023-06-01 MED ORDER — SCOPOLAMINE 1 MG/3DAYS TD PT72
MEDICATED_PATCH | TRANSDERMAL | Status: AC
  Administered 2023-06-01: 1.5 mg via TRANSDERMAL
  Filled 2023-06-01: qty 1

## 2023-06-01 MED ORDER — ACETAMINOPHEN 500 MG PO TABS: 1000.0000 mg | ORAL_TABLET | Freq: Once | ORAL | Status: AC

## 2023-06-01 MED ORDER — FENTANYL CITRATE (PF) 250 MCG/5ML IJ SOLN
INTRAMUSCULAR | Status: DC | PRN
  Administered 2023-06-01: 75 ug via INTRAVENOUS
  Administered 2023-06-01: 50 ug via INTRAVENOUS

## 2023-06-01 MED ORDER — PROPOFOL 10 MG/ML IV BOLUS
INTRAVENOUS | Status: DC | PRN
  Administered 2023-06-01: 160 mg via INTRAVENOUS

## 2023-06-01 MED ORDER — PROMETHAZINE HCL 25 MG/ML IJ SOLN: 6.2500 mg | INTRAMUSCULAR | Status: DC | PRN

## 2023-06-01 MED ORDER — ACETAMINOPHEN 500 MG PO TABS: 1000.0000 mg | ORAL_TABLET | ORAL | Status: AC

## 2023-06-01 MED ORDER — ROCURONIUM BROMIDE 10 MG/ML (PF) SYRINGE
PREFILLED_SYRINGE | INTRAVENOUS | Status: AC
  Filled 2023-06-01: qty 10

## 2023-06-01 MED ORDER — SUGAMMADEX SODIUM 200 MG/2ML IV SOLN
INTRAVENOUS | Status: DC | PRN
  Administered 2023-06-01: 200 mg via INTRAVENOUS

## 2023-06-01 MED ORDER — CHLORHEXIDINE GLUCONATE CLOTH 2 % EX PADS: 6.0000 | MEDICATED_PAD | Freq: Once | CUTANEOUS | Status: DC

## 2023-06-01 MED ORDER — CEFAZOLIN SODIUM-DEXTROSE 2-4 GM/100ML-% IV SOLN
INTRAVENOUS | Status: AC
  Filled 2023-06-01: qty 100

## 2023-06-01 MED ORDER — ROCURONIUM BROMIDE 10 MG/ML (PF) SYRINGE
PREFILLED_SYRINGE | INTRAVENOUS | Status: DC | PRN
  Administered 2023-06-01: 50 mg via INTRAVENOUS
  Administered 2023-06-01: 20 mg via INTRAVENOUS

## 2023-06-01 MED ORDER — FENTANYL CITRATE (PF) 100 MCG/2ML IJ SOLN
25.0000 ug | INTRAMUSCULAR | Status: DC | PRN
  Administered 2023-06-01 (×2): 25 ug via INTRAVENOUS
  Administered 2023-06-01: 50 ug via INTRAVENOUS
  Administered 2023-06-01: 25 ug via INTRAVENOUS

## 2023-06-01 MED ORDER — ACETAMINOPHEN 500 MG PO TABS
ORAL_TABLET | ORAL | Status: AC
  Administered 2023-06-01: 1000 mg via ORAL
  Filled 2023-06-01: qty 2

## 2023-06-01 MED ORDER — MIDAZOLAM HCL 2 MG/2ML IJ SOLN
INTRAMUSCULAR | Status: DC | PRN
  Administered 2023-06-01: 2 mg via INTRAVENOUS

## 2023-06-01 MED ORDER — ONDANSETRON HCL 4 MG/2ML IJ SOLN
INTRAMUSCULAR | Status: DC | PRN
  Administered 2023-06-01: 4 mg via INTRAVENOUS

## 2023-06-01 MED ORDER — 0.9 % SODIUM CHLORIDE (POUR BTL) OPTIME
TOPICAL | Status: DC | PRN
  Administered 2023-06-01: 1000 mL

## 2023-06-01 MED ORDER — LIDOCAINE 2% (20 MG/ML) 5 ML SYRINGE
INTRAMUSCULAR | Status: AC
  Filled 2023-06-01: qty 5

## 2023-06-01 MED ORDER — GABAPENTIN 300 MG PO CAPS: 300.0000 mg | ORAL_CAPSULE | ORAL | Status: DC

## 2023-06-01 MED ORDER — KETOROLAC TROMETHAMINE 30 MG/ML IJ SOLN
INTRAMUSCULAR | Status: DC | PRN
Start: 2023-06-01 — End: 2023-06-01
  Administered 2023-06-01: 30 mg via INTRAVENOUS

## 2023-06-01 MED ORDER — CHLORHEXIDINE GLUCONATE 0.12 % MT SOLN
OROMUCOSAL | Status: AC
  Administered 2023-06-01: 15 mL via OROMUCOSAL
  Filled 2023-06-01: qty 15

## 2023-06-01 MED ORDER — DROPERIDOL 2.5 MG/ML IJ SOLN: 0.6250 mg | Freq: Once | INTRAMUSCULAR | Status: DC | PRN

## 2023-06-01 MED ORDER — OXYCODONE HCL 5 MG PO TABS: 5.0000 mg | ORAL_TABLET | Freq: Once | ORAL | Status: AC | PRN

## 2023-06-01 MED ORDER — LIDOCAINE 2% (20 MG/ML) 5 ML SYRINGE
INTRAMUSCULAR | Status: DC | PRN
  Administered 2023-06-01: 50 mg via INTRAVENOUS

## 2023-06-01 MED ORDER — PROPOFOL 1000 MG/100ML IV EMUL
INTRAVENOUS | Status: AC
  Filled 2023-06-01: qty 100

## 2023-06-01 MED ORDER — FENTANYL CITRATE (PF) 250 MCG/5ML IJ SOLN
INTRAMUSCULAR | Status: AC
  Filled 2023-06-01: qty 5

## 2023-06-01 MED ORDER — ACETAMINOPHEN 10 MG/ML IV SOLN: 1000.0000 mg | Freq: Once | INTRAVENOUS | Status: DC | PRN

## 2023-06-01 MED ORDER — ORAL CARE MOUTH RINSE: 15.0000 mL | Freq: Once | OROMUCOSAL | Status: AC

## 2023-06-01 MED ORDER — SCOPOLAMINE 1 MG/3DAYS TD PT72: 1.0000 | MEDICATED_PATCH | TRANSDERMAL | Status: DC

## 2023-06-01 SURGICAL SUPPLY — 45 items
ADH SKN CLS APL DERMABOND .7 (GAUZE/BANDAGES/DRESSINGS) ×1
APL PRP STRL LF DISP 70% ISPRP (MISCELLANEOUS) ×1
APPLIER CLIP 5 13 M/L LIGAMAX5 (MISCELLANEOUS) ×1
APR CLP MED LRG 5 ANG JAW (MISCELLANEOUS) ×1
BAG COUNTER SPONGE SURGICOUNT (BAG) ×1 IMPLANT
BAG SPEC RTRVL 10 TROC 200 (ENDOMECHANICALS) ×1
BAG SPNG CNTER NS LX DISP (BAG) ×1
BLADE CLIPPER SURG (BLADE) IMPLANT
CANISTER SUCT 3000ML PPV (MISCELLANEOUS) ×1 IMPLANT
CHLORAPREP W/TINT 26 (MISCELLANEOUS) ×1 IMPLANT
CLIP APPLIE 5 13 M/L LIGAMAX5 (MISCELLANEOUS) ×1 IMPLANT
COVER SURGICAL LIGHT HANDLE (MISCELLANEOUS) ×1 IMPLANT
DERMABOND ADVANCED .7 DNX12 (GAUZE/BANDAGES/DRESSINGS) ×1 IMPLANT
ELECT REM PT RETURN 9FT ADLT (ELECTROSURGICAL) ×1
ELECTRODE REM PT RTRN 9FT ADLT (ELECTROSURGICAL) ×1 IMPLANT
GLOVE BIO SURGEON STRL SZ8 (GLOVE) ×1 IMPLANT
GLOVE BIOGEL PI IND STRL 8 (GLOVE) ×1 IMPLANT
GOWN STRL REUS W/ TWL LRG LVL3 (GOWN DISPOSABLE) ×2 IMPLANT
GOWN STRL REUS W/ TWL XL LVL3 (GOWN DISPOSABLE) ×1 IMPLANT
GOWN STRL REUS W/TWL LRG LVL3 (GOWN DISPOSABLE) ×2
GOWN STRL REUS W/TWL XL LVL3 (GOWN DISPOSABLE) ×1
IRRIG SUCT STRYKERFLOW 2 WTIP (MISCELLANEOUS) ×1
IRRIGATION SUCT STRKRFLW 2 WTP (MISCELLANEOUS) ×1 IMPLANT
KIT BASIN OR (CUSTOM PROCEDURE TRAY) ×1 IMPLANT
KIT TURNOVER KIT B (KITS) ×1 IMPLANT
L-HOOK LAP DISP 36CM (ELECTROSURGICAL) ×1
LHOOK LAP DISP 36CM (ELECTROSURGICAL) ×1 IMPLANT
NDL 22X1.5 STRL (OR ONLY) (MISCELLANEOUS) ×1 IMPLANT
NEEDLE 22X1.5 STRL (OR ONLY) (MISCELLANEOUS) ×1
NS IRRIG 1000ML POUR BTL (IV SOLUTION) ×1 IMPLANT
PAD ARMBOARD 7.5X6 YLW CONV (MISCELLANEOUS) ×1 IMPLANT
PENCIL BUTTON HOLSTER BLD 10FT (ELECTRODE) ×1 IMPLANT
POUCH RETRIEVAL ECOSAC 10 (ENDOMECHANICALS) ×1 IMPLANT
SCISSORS LAP 5X35 DISP (ENDOMECHANICALS) ×1 IMPLANT
SET TUBE SMOKE EVAC HIGH FLOW (TUBING) ×1 IMPLANT
SLEEVE Z-THREAD 5X100MM (TROCAR) ×2 IMPLANT
SPECIMEN JAR SMALL (MISCELLANEOUS) ×1 IMPLANT
SUT VIC AB 4-0 PS2 27 (SUTURE) ×1 IMPLANT
TOWEL GREEN STERILE (TOWEL DISPOSABLE) ×1 IMPLANT
TOWEL GREEN STERILE FF (TOWEL DISPOSABLE) ×1 IMPLANT
TRAY LAPAROSCOPIC MC (CUSTOM PROCEDURE TRAY) ×1 IMPLANT
TROCAR BALLN 12MMX100 BLUNT (TROCAR) ×1 IMPLANT
TROCAR Z-THREAD OPTICAL 5X100M (TROCAR) ×1 IMPLANT
WARMER LAPAROSCOPE (MISCELLANEOUS) ×1 IMPLANT
WATER STERILE IRR 1000ML POUR (IV SOLUTION) ×1 IMPLANT

## 2023-06-01 NOTE — Transfer of Care (Signed)
Immediate Anesthesia Transfer of Care Note  Patient: Jocelyn White  Procedure(s) Performed: LAPAROSCOPIC CHOLECYSTECTOMY (Abdomen)  Patient Location: PACU  Anesthesia Type:General  Level of Consciousness: awake and alert   Airway & Oxygen Therapy: Patient Spontanous Breathing  Post-op Assessment: Report given to RN  Post vital signs: Reviewed and stable  Last Vitals:  Vitals Value Taken Time  BP 150/80 06/01/23 0845  Temp 36.5 C 06/01/23 0839  Pulse 59 06/01/23 0859  Resp 18 06/01/23 0859  SpO2 96 % 06/01/23 0859  Vitals shown include unfiled device data.  Last Pain:  Vitals:   06/01/23 0607  TempSrc:   PainSc: 0-No pain         Complications: No notable events documented.

## 2023-06-01 NOTE — H&P (Signed)
Jocelyn White is an 67 y.o. female.   Chief Complaint: for lap chole HPI: Presents for laparoscopic cholecystectomy. She has had some ongoing, though milder symptoms, since I saw her in the office. No other new issues.  Past Medical History:  Diagnosis Date   Arthritis    left knee   DVT (deep venous thrombosis) (HCC)    GERD (gastroesophageal reflux disease)    Hypertension    Hypothyroidism    Obesity    Thyroid disease     Past Surgical History:  Procedure Laterality Date   EYE SURGERY Left 2022   FRACTURE SURGERY Left 2023   left ankle    Family History  Problem Relation Age of Onset   Cancer Other    Heart attack Other    Stroke Other    Diabetes Other    Social History:  reports that she has quit smoking. She has never used smokeless tobacco. She reports that she does not drink alcohol and does not use drugs.  Allergies:  Allergies  Allergen Reactions   Lisinopril     Other reaction(s): cough   Nabumetone     Other reaction(s): dizziness    Medications Prior to Admission  Medication Sig Dispense Refill   ferrous sulfate 324 MG TBEC Take 324 mg by mouth every other day.     gabapentin (NEURONTIN) 100 MG capsule Take 200 mg by mouth every morning.     ibuprofen (ADVIL) 200 MG tablet Take 200 mg by mouth every 6 (six) hours as needed for mild pain or moderate pain.     levothyroxine (SYNTHROID) 125 MCG tablet Take 125 mcg by mouth daily before breakfast.     losartan (COZAAR) 50 MG tablet Take 50 mg by mouth daily.     omeprazole (PRILOSEC) 40 MG capsule Take 40 mg by mouth daily before breakfast. 30 in before morning meal     oxyCODONE-acetaminophen (PERCOCET/ROXICET) 5-325 MG tablet Take 1 tablet by mouth every 6 (six) hours as needed for severe pain. 10 tablet 0   Polyethyl Glycol-Propyl Glycol (SYSTANE) 0.4-0.3 % SOLN Place 1 drop into both eyes daily as needed (Dry eye).     rosuvastatin (CRESTOR) 10 MG tablet Take 10 mg by mouth daily.     albuterol  (VENTOLIN HFA) 108 (90 Base) MCG/ACT inhaler Inhale 2 puffs into the lungs every 4 (four) hours as needed for wheezing or shortness of breath.     dicyclomine (BENTYL) 20 MG tablet Take 1 tablet (20 mg total) by mouth 2 (two) times daily as needed for spasms. 20 tablet 0   meclizine (ANTIVERT) 25 MG tablet Take 1 tablet (25 mg total) by mouth 3 (three) times daily as needed for dizziness. 20 tablet 0   ondansetron (ZOFRAN) 4 MG tablet Take 1 tablet (4 mg total) by mouth every 6 (six) hours as needed for nausea or vomiting. 18 tablet 0    No results found for this or any previous visit (from the past 48 hour(s)). No results found.  Review of Systems  Blood pressure 138/72, pulse 72, temperature 97.9 F (36.6 C), temperature source Oral, resp. rate 18, height 5\' 3"  (1.6 m), weight 108.9 kg, SpO2 96%. Physical Exam HENT:     Head: Normocephalic.     Mouth/Throat:     Mouth: Mucous membranes are moist.  Cardiovascular:     Rate and Rhythm: Normal rate and regular rhythm.     Pulses: Normal pulses.  Pulmonary:     Effort: Pulmonary  effort is normal.     Breath sounds: Normal breath sounds.  Abdominal:     General: Abdomen is flat.     Palpations: Abdomen is soft.     Tenderness: There is no abdominal tenderness. There is no guarding.  Skin:    General: Skin is warm.  Neurological:     Mental Status: She is alert and oriented to person, place, and time.  Psychiatric:        Mood and Affect: Mood normal.      Assessment/Plan Biliary colic - for laparoscopic cholecystectomy. Procedure, risks, and benefits again discussed. She agrees.  Liz Malady, MD 06/01/2023, 6:55 AM

## 2023-06-01 NOTE — Op Note (Signed)
  06/01/2023  8:42 AM  PATIENT:  Jocelyn White  67 y.o. female  PRE-OPERATIVE DIAGNOSIS: Biliary colic  POST-OPERATIVE DIAGNOSIS: Chronic cholecystitis  PROCEDURE:  Procedure(s): LAPAROSCOPIC CHOLECYSTECTOMY  SURGEON:  Surgeon(s): Violeta Gelinas, MD  ASSISTANTS: none   ANESTHESIA:   local and general  EBL:  Total I/O In: 100 [IV Piggyback:100] Out: 50 [Blood:50]  BLOOD ADMINISTERED:none  DRAINS: none   SPECIMEN:  Excision  DISPOSITION OF SPECIMEN:  PATHOLOGY  COUNTS:  YES  DICTATION: .Dragon Dictation Findings: Evidence of chronic inflammation  Procedure in detail: Informed consent was obtained.  She was given IV antibiotics.  She was brought to the operating room and general endotracheal anesthesia was administered by the anesthesia staff.  Her abdomen was prepped and draped in a sterile fashion.  We did a timeout procedure.The supraumbilical region was infiltrated with local. Supraumbilical incision was made. Subcutaneous tissues were dissected down revealing the anterior fascia. This was divided sharply along the midline. Peritoneal cavity was entered under direct vision without complication. A 0 Vicryl pursestring was placed around the fascial opening. Hassan trocar was inserted into the abdomen. The abdomen was insufflated with carbon dioxide in standard fashion. Under direct vision a 5 mm epigastric and 5 mm right abdominal port x 2 were placed.  Local was used at each port site.  Laparoscopic exploration revealed chronic inflammation of the gallbladder with a lot of omental adhesions.  The omentum was gradually dissected off of the gallbladder carefully.  This revealed the body and eventually the infundibulum of the gallbladder.  The dome was then retracted superior and medially.  The infundibulum was retracted inferior and laterally.  Dissection began laterally and progressed medially.  First we identified a anterior branch of the cystic artery.  This was clipped  twice proximally, once distally and divided.  The gallbladder was dissected until we had a critical view of safety around the cystic duct.  We then placed 3 clips proximally on the cystic duct, 1 distally and it was divided.  The gallbladder was taken off the liver bed with Bovie cautery.  There was a posterior branch of the cystic artery which was clipped twice proximally.  I divided this distally with cautery.  The gallbladder was taken the rest the way off the liver bed.  There was a small arterial vessel became out of the liver bed as well.  This was clipped.  The gallbladder was removed the rest of the way from the liver bed and placed in a bag.  It was removed from the abdomen.  The liver bed was copiously irrigated and hemostasis was ensured.  The clips all remain in good position.  The liver bed was then dry.  Ports were removed under direct vision.  Pneumoperitoneum was released.  Supraumbilical fascia was closed by tying the pursestring.  All 4 wounds were irrigated and the skin of each was closed with 4-0 Vicryl followed by Dermabond.  All counts were correct.  She tolerated the procedure well without apparent complication and was taken recovery in stable condition.  PATIENT DISPOSITION:  PACU - hemodynamically stable.   Delay start of Pharmacological VTE agent (>24hrs) due to surgical blood loss or risk of bleeding:  no  Violeta Gelinas, MD, MPH, FACS Pager: [phone]  9/23/20248:42 AM

## 2023-06-01 NOTE — Anesthesia Procedure Notes (Signed)
Procedure Name: Intubation Date/Time: 06/01/2023 7:52 AM  Performed by: Shelton Silvas, MDPre-anesthesia Checklist: Patient identified, Patient being monitored, Timeout performed, Emergency Drugs available and Suction available Patient Re-evaluated:Patient Re-evaluated prior to induction Oxygen Delivery Method: Circle system utilized Preoxygenation: Pre-oxygenation with 100% oxygen Induction Type: IV induction Ventilation: Mask ventilation without difficulty Laryngoscope Size: Mac and 3 Grade View: Grade I Tube type: Oral Endobronchial tube: Right Laser Tube: Cuffed inflated with minimal occlusive pressure - saline Tube size: 7.5 mm Number of attempts: 1 Airway Equipment and Method: Stylet Placement Confirmation: ETT inserted through vocal cords under direct vision, positive ETCO2 and breath sounds checked- equal and bilateral Secured at: 21 cm Tube secured with: Tape Dental Injury: Teeth and Oropharynx as per pre-operative assessment

## 2023-06-01 NOTE — Anesthesia Postprocedure Evaluation (Signed)
Anesthesia Post Note  Patient: Jocelyn White  Procedure(s) Performed: LAPAROSCOPIC CHOLECYSTECTOMY (Abdomen)     Patient location during evaluation: PACU Anesthesia Type: General Level of consciousness: awake and alert Pain management: pain level controlled Vital Signs Assessment: post-procedure vital signs reviewed and stable Respiratory status: spontaneous breathing, nonlabored ventilation, respiratory function stable and patient connected to nasal cannula oxygen Cardiovascular status: blood pressure returned to baseline and stable Postop Assessment: no apparent nausea or vomiting Anesthetic complications: no  No notable events documented.  Last Vitals:  Vitals:   06/01/23 0945 06/01/23 1015  BP: 135/75   Pulse: 63 (!) 53  Resp: 16   Temp: 36.5 C   SpO2: 94% 94%    Last Pain:  Vitals:   06/01/23 0930  TempSrc:   PainSc: 6                  Shelton Silvas

## 2023-06-02 ENCOUNTER — Encounter (HOSPITAL_COMMUNITY): Payer: Self-pay | Admitting: General Surgery

## 2023-06-02 LAB — SURGICAL PATHOLOGY

## 2023-06-17 ENCOUNTER — Telehealth: Payer: Self-pay | Admitting: Podiatry

## 2023-06-17 NOTE — Telephone Encounter (Signed)
Pt called asking if she could get a new handicap sticker as her has expired.

## 2023-07-06 ENCOUNTER — Encounter: Payer: Self-pay | Admitting: Podiatry

## 2023-07-06 ENCOUNTER — Ambulatory Visit (INDEPENDENT_AMBULATORY_CARE_PROVIDER_SITE_OTHER): Payer: BC Managed Care – PPO | Admitting: Podiatry

## 2023-07-06 DIAGNOSIS — L603 Nail dystrophy: Secondary | ICD-10-CM

## 2023-07-06 DIAGNOSIS — M76822 Posterior tibial tendinitis, left leg: Secondary | ICD-10-CM

## 2023-07-06 DIAGNOSIS — M7989 Other specified soft tissue disorders: Secondary | ICD-10-CM | POA: Diagnosis not present

## 2023-07-06 NOTE — Patient Instructions (Signed)
Start a biotin supplement You can use UREA NAIL GEL on the toenails

## 2023-07-06 NOTE — Progress Notes (Signed)
Subjective: Chief Complaint  Patient presents with   Routine Post Op    PATIENT STATES IT HURTS HER TO STAND FOR ALONG TIME AND WAKING A LONG WAY IT HURTS A LOT AND IT GOES UP HER LEG ON HER LF , PLEASE TAKE A LOOK AT HER TWO HALLUX THEY CAME BACK AND SPLIT . PATIENT TAKES IBPROINE  FOR PAIN.    67 year old female presents the office today for long-term follow-up of her foot pain.  She states she has swelling to both of her legs.  She also states that both great toenails have come back and they have started this foot.  She has no pain to the toenails himself.  She is ibuprofen General For discomfort or fever they do hurt.  No recent injuries to her feet.  No other concerns.  Objective: AAO x3, NAD DP/PT pulses palpable bilaterally, CRT less than 3 seconds Chronic bilateral lower extremity edema Bilateral hallux nails have grown back how they are split distally.  They are loose in the nail bed distally as well as a year proximally.  No pain.  No edema, erythema or signs of infection.  There is no open lesions. Patient has noted pain with course the posterior tibial, flexor tendons.  She states her symptoms are much improved compared to what it was prior to surgery.  There is no erythema point tenderness. No pain with calf compression, swelling, warmth, erythema  Assessment: Onychodystrophy, bilateral lower extremity edema; PTTD  Plan: -All treatment options discussed with the patient including all alternatives, risks, complications.  -For the toenails I think they more from damage in her foot.  Discussed biotin supplement as well as urea nail gel. -Continue supportive shoes, good arch support given the history of tendon repair. -She does have some chronic appearing edema present bilaterally.  Recommend follow with her PCP.  She may benefit from Lasix or further workup of the swelling.  Return if symptoms worsen or fail to improve.  Vivi Barrack DPM

## 2023-07-20 DIAGNOSIS — R92323 Mammographic fibroglandular density, bilateral breasts: Secondary | ICD-10-CM | POA: Diagnosis not present

## 2023-07-20 DIAGNOSIS — Z1231 Encounter for screening mammogram for malignant neoplasm of breast: Secondary | ICD-10-CM | POA: Diagnosis not present

## 2023-09-11 DIAGNOSIS — R7303 Prediabetes: Secondary | ICD-10-CM | POA: Diagnosis not present

## 2023-09-11 DIAGNOSIS — E039 Hypothyroidism, unspecified: Secondary | ICD-10-CM | POA: Diagnosis not present

## 2023-09-11 DIAGNOSIS — E7800 Pure hypercholesterolemia, unspecified: Secondary | ICD-10-CM | POA: Diagnosis not present

## 2023-09-11 DIAGNOSIS — I10 Essential (primary) hypertension: Secondary | ICD-10-CM | POA: Diagnosis not present

## 2023-09-14 DIAGNOSIS — G629 Polyneuropathy, unspecified: Secondary | ICD-10-CM | POA: Diagnosis not present

## 2023-09-14 DIAGNOSIS — E7800 Pure hypercholesterolemia, unspecified: Secondary | ICD-10-CM | POA: Diagnosis not present

## 2023-09-14 DIAGNOSIS — Z0000 Encounter for general adult medical examination without abnormal findings: Secondary | ICD-10-CM | POA: Diagnosis not present

## 2023-09-14 DIAGNOSIS — E039 Hypothyroidism, unspecified: Secondary | ICD-10-CM | POA: Diagnosis not present

## 2023-09-14 DIAGNOSIS — I10 Essential (primary) hypertension: Secondary | ICD-10-CM | POA: Diagnosis not present

## 2023-09-25 DIAGNOSIS — I10 Essential (primary) hypertension: Secondary | ICD-10-CM | POA: Diagnosis not present

## 2023-09-25 DIAGNOSIS — S39012A Strain of muscle, fascia and tendon of lower back, initial encounter: Secondary | ICD-10-CM | POA: Diagnosis not present

## 2023-09-25 DIAGNOSIS — R1084 Generalized abdominal pain: Secondary | ICD-10-CM | POA: Diagnosis not present

## 2023-09-25 DIAGNOSIS — J90 Pleural effusion, not elsewhere classified: Secondary | ICD-10-CM | POA: Diagnosis not present

## 2023-09-25 DIAGNOSIS — S3981XA Other specified injuries of abdomen, initial encounter: Secondary | ICD-10-CM | POA: Diagnosis not present

## 2023-09-25 DIAGNOSIS — S301XXA Contusion of abdominal wall, initial encounter: Secondary | ICD-10-CM | POA: Diagnosis not present

## 2023-09-25 DIAGNOSIS — M62830 Muscle spasm of back: Secondary | ICD-10-CM | POA: Diagnosis not present

## 2023-09-25 DIAGNOSIS — R0902 Hypoxemia: Secondary | ICD-10-CM | POA: Diagnosis not present

## 2023-09-25 DIAGNOSIS — R1032 Left lower quadrant pain: Secondary | ICD-10-CM | POA: Diagnosis not present

## 2023-09-26 DIAGNOSIS — I4892 Unspecified atrial flutter: Secondary | ICD-10-CM | POA: Diagnosis not present

## 2023-09-26 DIAGNOSIS — S301XXA Contusion of abdominal wall, initial encounter: Secondary | ICD-10-CM | POA: Diagnosis not present

## 2023-09-30 DIAGNOSIS — M549 Dorsalgia, unspecified: Secondary | ICD-10-CM | POA: Diagnosis not present

## 2023-09-30 DIAGNOSIS — R109 Unspecified abdominal pain: Secondary | ICD-10-CM | POA: Diagnosis not present

## 2023-09-30 DIAGNOSIS — M79601 Pain in right arm: Secondary | ICD-10-CM | POA: Diagnosis not present

## 2023-10-21 DIAGNOSIS — M25521 Pain in right elbow: Secondary | ICD-10-CM | POA: Diagnosis not present

## 2023-10-26 ENCOUNTER — Inpatient Hospital Stay: Payer: BC Managed Care – PPO | Attending: Nurse Practitioner | Admitting: Nurse Practitioner

## 2023-10-26 ENCOUNTER — Inpatient Hospital Stay: Payer: BC Managed Care – PPO

## 2023-10-26 ENCOUNTER — Encounter: Payer: Self-pay | Admitting: Nurse Practitioner

## 2023-10-26 VITALS — BP 133/82 | HR 73 | Temp 97.9°F | Resp 19 | Wt 241.6 lb

## 2023-10-26 DIAGNOSIS — Z8249 Family history of ischemic heart disease and other diseases of the circulatory system: Secondary | ICD-10-CM | POA: Insufficient documentation

## 2023-10-26 DIAGNOSIS — Z823 Family history of stroke: Secondary | ICD-10-CM | POA: Diagnosis not present

## 2023-10-26 DIAGNOSIS — R718 Other abnormality of red blood cells: Secondary | ICD-10-CM | POA: Diagnosis not present

## 2023-10-26 DIAGNOSIS — Z833 Family history of diabetes mellitus: Secondary | ICD-10-CM | POA: Diagnosis not present

## 2023-10-26 DIAGNOSIS — Z9049 Acquired absence of other specified parts of digestive tract: Secondary | ICD-10-CM | POA: Insufficient documentation

## 2023-10-26 DIAGNOSIS — E785 Hyperlipidemia, unspecified: Secondary | ICD-10-CM | POA: Insufficient documentation

## 2023-10-26 DIAGNOSIS — K219 Gastro-esophageal reflux disease without esophagitis: Secondary | ICD-10-CM | POA: Diagnosis not present

## 2023-10-26 DIAGNOSIS — D649 Anemia, unspecified: Secondary | ICD-10-CM | POA: Diagnosis not present

## 2023-10-26 DIAGNOSIS — D696 Thrombocytopenia, unspecified: Secondary | ICD-10-CM

## 2023-10-26 DIAGNOSIS — I10 Essential (primary) hypertension: Secondary | ICD-10-CM | POA: Diagnosis not present

## 2023-10-26 DIAGNOSIS — D509 Iron deficiency anemia, unspecified: Secondary | ICD-10-CM

## 2023-10-26 DIAGNOSIS — Z87891 Personal history of nicotine dependence: Secondary | ICD-10-CM | POA: Diagnosis not present

## 2023-10-26 DIAGNOSIS — Z86718 Personal history of other venous thrombosis and embolism: Secondary | ICD-10-CM | POA: Diagnosis not present

## 2023-10-26 DIAGNOSIS — Z79890 Hormone replacement therapy (postmenopausal): Secondary | ICD-10-CM | POA: Diagnosis not present

## 2023-10-26 DIAGNOSIS — Z809 Family history of malignant neoplasm, unspecified: Secondary | ICD-10-CM | POA: Diagnosis not present

## 2023-10-26 DIAGNOSIS — Z79899 Other long term (current) drug therapy: Secondary | ICD-10-CM | POA: Diagnosis not present

## 2023-10-26 DIAGNOSIS — E039 Hypothyroidism, unspecified: Secondary | ICD-10-CM | POA: Diagnosis not present

## 2023-10-26 DIAGNOSIS — Z888 Allergy status to other drugs, medicaments and biological substances status: Secondary | ICD-10-CM | POA: Diagnosis not present

## 2023-10-26 LAB — CBC WITH DIFFERENTIAL (CANCER CENTER ONLY)
Abs Immature Granulocytes: 0.03 10*3/uL (ref 0.00–0.07)
Basophils Absolute: 0 10*3/uL (ref 0.0–0.1)
Basophils Relative: 1 %
Eosinophils Absolute: 0.1 10*3/uL (ref 0.0–0.5)
Eosinophils Relative: 1 %
HCT: 41.3 % (ref 36.0–46.0)
Hemoglobin: 12.6 g/dL (ref 12.0–15.0)
Immature Granulocytes: 0 %
Lymphocytes Relative: 34 %
Lymphs Abs: 2.6 10*3/uL (ref 0.7–4.0)
MCH: 22.4 pg — ABNORMAL LOW (ref 26.0–34.0)
MCHC: 30.5 g/dL (ref 30.0–36.0)
MCV: 73.5 fL — ABNORMAL LOW (ref 80.0–100.0)
Monocytes Absolute: 0.4 10*3/uL (ref 0.1–1.0)
Monocytes Relative: 6 %
Neutro Abs: 4.4 10*3/uL (ref 1.7–7.7)
Neutrophils Relative %: 58 %
Platelet Count: 126 10*3/uL — ABNORMAL LOW (ref 150–400)
RBC: 5.62 MIL/uL — ABNORMAL HIGH (ref 3.87–5.11)
RDW: 16.1 % — ABNORMAL HIGH (ref 11.5–15.5)
WBC Count: 7.5 10*3/uL (ref 4.0–10.5)
nRBC: 0 % (ref 0.0–0.2)

## 2023-10-26 LAB — FERRITIN: Ferritin: 90 ng/mL (ref 11–307)

## 2023-10-26 LAB — RETIC PANEL
Immature Retic Fract: 14.2 % (ref 2.3–15.9)
RBC.: 5.65 MIL/uL — ABNORMAL HIGH (ref 3.87–5.11)
Retic Count, Absolute: 76.3 10*3/uL (ref 19.0–186.0)
Retic Ct Pct: 1.4 % (ref 0.4–3.1)
Reticulocyte Hemoglobin: 25.1 pg — ABNORMAL LOW (ref >27.9)

## 2023-10-26 LAB — HEPATITIS B SURFACE ANTIBODY,QUALITATIVE: Hep B S Ab: NONREACTIVE

## 2023-10-26 LAB — IMMATURE PLATELET FRACTION: Immature Platelet Fraction: 14.5 % — ABNORMAL HIGH (ref 1.2–8.6)

## 2023-10-26 LAB — IRON AND IRON BINDING CAPACITY (CC-WL,HP ONLY)
Iron: 93 ug/dL (ref 28–170)
Saturation Ratios: 26 % (ref 10.4–31.8)
TIBC: 354 ug/dL (ref 250–450)
UIBC: 261 ug/dL (ref 148–442)

## 2023-10-26 LAB — HIV ANTIBODY (ROUTINE TESTING W REFLEX): HIV Screen 4th Generation wRfx: NONREACTIVE

## 2023-10-26 LAB — FOLATE: Folate: 9.9 ng/mL (ref >5.9)

## 2023-10-26 LAB — HEPATITIS C ANTIBODY: HCV Ab: NONREACTIVE

## 2023-10-26 LAB — VITAMIN B12: Vitamin B-12: 222 pg/mL (ref 180–914)

## 2023-10-26 NOTE — Progress Notes (Cosign Needed)
[facility]   Telephone:([phone]) [phone] [phone]   Clinic New consult Note   Patient Care Team: Clovis Riley, L.August Saucer, MD (Inactive) as PCP - General (Family Medicine) 10/26/2023  CHIEF COMPLAINTS/PURPOSE OF CONSULTATION:  Thrombocytopenia  HISTORY OF PRESENTING ILLNESS:  Jocelyn White 68 y.o. female with PMH including HTN, HL, hypothyroidism, DVT, GERD, and chronic cholecystitis s/p cholecystectomy 06/01/2023 is here because of thrombocytopenia.  First found to have abnormal CBC 04/29/2014 with platelet count 106, again low 02/01/2017 to 123 but normalized on 03/19/2021.  On 04/11/2023 platelet count 132 that has slowly trended down again to 108 as of 09/26/2023.  She has a chronic microcytosis without anemia, MCV 69-72.  An abdominal ultrasound 04/11/2023 showed normal liver echogenicity. Has never been checked for HIV/hepatitis or folate deficiency. Takes oral iron for past 2-3 months. Not on anticoagulation or antiplatelet. Not sure how she acquired hypothyroidism but could be autoimmune.   Socially, she is married with 1 daughter. Works [employer].  Independent with ADLs and drives.  Denies alcohol or drug use, quit cigarettes in 1986 and only smoked socially.  She is up-to-date on age-appropriate cancer screenings.  Today she presents with her daughter, feeling well in general with arthritis pain in her right elbow and knees.  Weight fluctuates, energy level is normal.  Bowels moving normally.  She denies any bleeding, night sweats, fever, n/v, pain, cough, chest pain, dyspnea, leg edema.    MEDICAL HISTORY:  Past Medical History:  Diagnosis Date   Arthritis    left knee   DVT (deep venous thrombosis) (HCC)    GERD (gastroesophageal reflux disease)    Hypertension    Hypothyroidism    Obesity    Thyroid disease     SURGICAL HISTORY: Past Surgical History:  Procedure Laterality Date   CHOLECYSTECTOMY N/A 06/01/2023   Procedure: LAPAROSCOPIC  CHOLECYSTECTOMY;  Surgeon: Violeta Gelinas, MD;  Location: [facility] OR;  Service: General;  Laterality: N/A;   EYE SURGERY Left 2022   FRACTURE SURGERY Left 2023   left ankle    SOCIAL HISTORY: Social History   Socioeconomic History   Marital status: Married    Spouse name: Not on file   Number of children: Not on file   Years of education: Not on file   Highest education level: Not on file  Occupational History   Not on file  Tobacco Use   Smoking status: Former   Smokeless tobacco: Never  Vaping Use   Vaping status: Never Used  Substance and Sexual Activity   Alcohol use: No   Drug use: No   Sexual activity: Not on file  Other Topics Concern   Not on file  Social History Narrative   Not on file   Social Drivers of Health   Financial Resource Strain: Not on file  Food Insecurity: No Food Insecurity (10/26/2023)   Hunger Vital Sign    Worried About Running Out of Food in the Last Year: Never true    Ran Out of Food in the Last Year: Never true  Transportation Needs: No Transportation Needs (10/26/2023)   PRAPARE - Administrator, Civil Service (Medical): No    Lack of Transportation (Non-Medical): No  Physical Activity: Not on file  Stress: Not on file  Social Connections: Unknown (01/18/2022)   [facility] from [facility], [facility]   Social Network    Social Network: Not on file  Intimate Partner Violence: Not At Risk (10/26/2023)   Humiliation, Afraid, Rape,  and Kick questionnaire    Fear of Current or Ex-Partner: No    Emotionally Abused: No    Physically Abused: No    Sexually Abused: No    FAMILY HISTORY: Family History  Problem Relation Age of Onset   Cancer Other    Heart attack Other    Stroke Other    Diabetes Other     ALLERGIES:  is allergic to lisinopril and nabumetone.  MEDICATIONS:  Current Outpatient Medications  Medication Sig Dispense Refill   albuterol (VENTOLIN HFA) 108 (90 Base) MCG/ACT inhaler Inhale 2 puffs into the  lungs every 4 (four) hours as needed for wheezing or shortness of breath.     dicyclomine (BENTYL) 20 MG tablet Take 1 tablet (20 mg total) by mouth 2 (two) times daily as needed for spasms. 20 tablet 0   ferrous sulfate 324 MG TBEC Take 324 mg by mouth every other day.     gabapentin (NEURONTIN) 100 MG capsule Take 200 mg by mouth every morning.     levothyroxine (SYNTHROID) 125 MCG tablet Take 125 mcg by mouth daily before breakfast.     losartan (COZAAR) 50 MG tablet Take 50 mg by mouth daily.     meclizine (ANTIVERT) 25 MG tablet Take 1 tablet (25 mg total) by mouth 3 (three) times daily as needed for dizziness. 20 tablet 0   omeprazole (PRILOSEC) 40 MG capsule Take 40 mg by mouth daily before breakfast. 30 in before morning meal     Polyethyl Glycol-Propyl Glycol (SYSTANE) 0.4-0.3 % SOLN Place 1 drop into both eyes daily as needed (Dry eye).     rosuvastatin (CRESTOR) 10 MG tablet Take 10 mg by mouth daily.     ibuprofen (ADVIL) 200 MG tablet Take 200 mg by mouth every 6 (six) hours as needed for mild pain or moderate pain.     ondansetron (ZOFRAN) 4 MG tablet Take 1 tablet (4 mg total) by mouth every 6 (six) hours as needed for nausea or vomiting. (Patient not taking: Reported on 10/26/2023) 18 tablet 0   oxyCODONE (OXY IR/ROXICODONE) 5 MG immediate release tablet Take 1 tablet (5 mg total) by mouth every 6 (six) hours as needed for up to 20 doses for severe pain. (Patient not taking: Reported on 10/26/2023) 20 tablet 0   No current facility-administered medications for this visit.    REVIEW OF SYSTEMS:   Constitutional: Denies fevers, chills, weight loss, or abnormal night sweats Eyes: Denies blurriness of vision, double vision or watery eyes Ears, nose, mouth, throat, and face: Denies epistaxis, mucositis or sore throat (+) hypothyroidism Respiratory: Denies cough, dyspnea or wheezes Cardiovascular: Denies palpitation, chest discomfort or lower extremity swelling (+) h/o  DVT Gastrointestinal:  Denies nausea, vomiting, constipation, diarrhea, pain, hematochezia, change in bowel habits (+) GERD Skin: Denies abnormal skin rashes Lymphatics: Denies new lymphadenopathy or easy bruising Neurological:Denies numbness, tingling or new weaknesses Behavioral/Psych: Mood is stable, no new changes  All other systems were reviewed with the patient and are negative.  PHYSICAL EXAMINATION: ECOG PERFORMANCE STATUS: 0 - Asymptomatic  Vitals:   10/26/23 1216  BP: 133/82  Pulse: 73  Resp: 19  Temp: 97.9 F (36.6 C)  SpO2: 98%   Filed Weights   10/26/23 1216  Weight: 241 lb 9.6 oz (109.6 kg)    GENERAL:alert, no distress and comfortable SKIN: skin color, texture, turgor are normal, no petechiae/ecchymoses, rashes or significant lesions EYES: normal, conjunctiva are pink and non-injected, sclera clear NECK: supple, thyroid normal size,  non-tender, without nodularity LYMPH:  no palpable cervical, supraclavicular, or axillary lymphadenopathy LUNGS: clear with normal breathing effort HEART: regular rate & rhythm, no lower extremity edema ABDOMEN:abdomen soft, non-tender and normal bowel sounds.  No hepatosplenomegaly Musculoskeletal:no cyanosis of digits and no clubbing  PSYCH: alert & oriented x 3 with fluent speech NEURO: no focal motor/sensory deficits  LABORATORY DATA:  I have reviewed the data as listed    Latest Ref Rng & Units 05/26/2023    3:30 PM 04/11/2023    4:49 PM 03/19/2021    3:05 PM  CBC  WBC 4.0 - 10.5 K/uL 7.6  10.4  9.9   Hemoglobin 12.0 - 15.0 g/dL 16.1  09.6  04.5   Hematocrit 36.0 - 46.0 % 39.6  39.2  41.8   Platelets 150 - 400 K/uL 122  132  155        Latest Ref Rng & Units 05/26/2023    3:30 PM 04/11/2023    4:49 PM 03/19/2021    3:05 PM  CMP  Glucose 70 - 99 mg/dL 95  96    BUN 8 - 23 mg/dL 13  12    Creatinine 4.09 - 1.00 mg/dL 8.11  9.14    Sodium 782 - 145 mmol/L 143  142    Potassium 3.5 - 5.1 mmol/L 3.9  3.9    Chloride 98  - 111 mmol/L 105  106    CO2 22 - 32 mmol/L 27  29    Calcium 8.9 - 10.3 mg/dL 8.8  8.9    Total Protein 6.5 - 8.1 g/dL 6.7  6.6  7.0   Total Bilirubin 0.3 - 1.2 mg/dL 0.6  0.5  <9.5   Alkaline Phos 38 - 126 U/L 66  72  89   AST 15 - 41 U/L 17  23  16    ALT 0 - 44 U/L 12  13  14       RADIOGRAPHIC STUDIES: I have personally reviewed the radiological images as listed and agreed with the findings in the report. No results found.  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  Will check immature platelet fraction -The suspicion for primary bone marrow condition such as MDS is very low given the mild and chronic nature of her thrombocytopenia, and no other significant cytopenias.  We are not recommending a bone marrow biopsy at this time -Will call her with the lab results. -Recommend monitoring/observation at this time, with CBC every 6 months -She does not need treatment such as Nplate or promacta at this time.   -Recommend more frequent checks if platelets drop below 50-100K, and consider starting medication for platelet 20-30K -She will see PCP in 6 months with CBC, then [place] in 1 year  Microcytosis, intermittent anemia -She has family history of anemia and is currently on oral iron, no available iron studies for review.  -Reportedly had a colonoscopy in 2019 that was clear, 10-year recall -Denies  any  bleeding  -Will check ferritin/iron/TIBC today -Given that she has microcytosis without anemia at times, will check hemoglobin electrophoresis.   -If negative will check alpha thalassemia gene mutation  H/o DVT -Denies h/o early pregnancy loss or other thrombosis -Unclear if this was provoked/unprevoked.  -Mother had DVT/PE -Will check lupus anticoagulant to r/o antiphospholipid syndrome which can present as thrombocytopenia in some cases    Plan: -Medical record reviewed -Labs today, will call with results -For now plan for CBC at PCP in 6 months, then lab/fup with [place] in 12 months  -She knows to call sooner if bleeding or other concerns -Pt seen with Dr. Mosetta Putt  Orders Placed This Encounter  Procedures   CBC with Differential (Cancer Center Only)    Standing Status:   Future    Number of Occurrences:   1    Expiration Date:   10/25/2024   Ferritin    Standing Status:   Future    Number of Occurrences:   1    Expiration Date:   10/25/2024   Iron and Iron Binding Capacity ([facility] only)    Standing Status:   Future    Number of Occurrences:   1    Expiration Date:   10/25/2024   Retic Panel    Standing Status:   Future    Number of Occurrences:   1    Expiration Date:   10/25/2024   Immature Platelet Fraction    Standing Status:   Future    Number of Occurrences:   1    Expiration Date:   10/25/2024   Lupus anticoagulant panel*    Standing Status:   Future    Number of Occurrences:   1    Expiration Date:   10/25/2024   Vitamin B12    Standing Status:   Future    Number of Occurrences:   1    Expiration Date:   10/25/2024   Folate, Serum    Standing Status:   Future    Number of Occurrences:   1    Expiration Date:   10/25/2024   Hgb Fractionation Cascade    Standing Status:   Future    Number of Occurrences:   1    Expiration Date:   10/25/2024   Hepatitis C antibody    Standing Status:   Future    Number of Occurrences:   1    Expiration Date:   10/25/2024    Hepatitis B surface antibody    Standing Status:   Future    Number of Occurrences:   1    Expiration Date:   10/25/2024   HIV antibody (with reflex)    Standing Status:   Future    Number of Occurrences:   1    Expiration Date:   10/25/2024      All questions were answered. The patient knows to call the clinic with any problems, questions or concerns.      Pollyann Samples, NP 10/26/23   Addendum I have seen the patient, examined her. I agree with the assessment and and plan and have edited the notes.   This is a 68 year old female with past medical history of hypertension, dyslipidemia, hypothyroidism, DVT and GERD, was referred for chronic thrombocytopenia.  Her platelet count has been slightly low since 2015, intermittent, she also has persistent microcytosis without anemia, no known history of liver disease or splenomegaly, last ultrasound of abdomen in August 2024.  This is likely  ITP, my suspicion for MDS is low, I do not think that she needs bone marrow biopsy.  Will check iron study, B12, folate, hepatitis B and C, and HIV, and lupus anticoagulant due to her history of DVT, to rule out other etiology of thrombocytopenia.  All questions were answered.  I spent a total of 30 minutes for her visit today, more than 50% time on face-to-face counseling.  Malachy Mood  10/26/2023

## 2023-10-27 LAB — LUPUS ANTICOAGULANT PANEL
DRVVT: 42.8 s (ref 0.0–47.0)
PTT Lupus Anticoagulant: 39 s (ref 0.0–43.5)

## 2023-10-28 LAB — HGB FRACTIONATION CASCADE
Hgb A2: 2.3 % (ref 1.8–3.2)
Hgb A: 97.7 % (ref 96.4–98.8)
Hgb F: 0 % (ref 0.0–2.0)
Hgb S: 0 %

## 2023-10-29 ENCOUNTER — Other Ambulatory Visit: Payer: Self-pay | Admitting: Nurse Practitioner

## 2023-10-29 ENCOUNTER — Telehealth: Payer: Self-pay

## 2023-10-29 DIAGNOSIS — R718 Other abnormality of red blood cells: Secondary | ICD-10-CM

## 2023-10-29 DIAGNOSIS — D696 Thrombocytopenia, unspecified: Secondary | ICD-10-CM

## 2023-10-29 NOTE — Telephone Encounter (Addendum)
Called patient to relay message below as per Dr. Mosetta Putt, patient had no further questions at this time and she voiced full understanding.    ----- Message from Pollyann Samples sent at 10/29/2023 12:00 PM EST ----- Please let pt know lab results: no hep B, hep C, or HIV. 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 She does not have beta thalassemia, but she may still have alpha thalassemia, will check that at next lab. 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 She also has low range B12 and some components of iron deficiency despite a normal ferritin and normal hgb, I recommend she take oral iron with vit C and B12 1000 mcg once daily and repeat labs in 3 months. Please let me know if she has any questions.   Thanks Lacie NP

## 2023-10-30 ENCOUNTER — Telehealth: Payer: Self-pay | Admitting: Nurse Practitioner

## 2023-10-30 NOTE — Telephone Encounter (Signed)
Accidental

## 2023-10-30 NOTE — Telephone Encounter (Signed)
Scheduled appointment per 2/20 scheduling message. Left the patient a voicemail with appointment details. Patient will be mailed an appointment reminder.

## 2023-11-03 DIAGNOSIS — E7800 Pure hypercholesterolemia, unspecified: Secondary | ICD-10-CM | POA: Diagnosis not present

## 2023-11-03 DIAGNOSIS — K219 Gastro-esophageal reflux disease without esophagitis: Secondary | ICD-10-CM | POA: Diagnosis not present

## 2023-11-03 DIAGNOSIS — I10 Essential (primary) hypertension: Secondary | ICD-10-CM | POA: Diagnosis not present

## 2024-01-26 ENCOUNTER — Inpatient Hospital Stay: Payer: BC Managed Care – PPO | Attending: Nurse Practitioner

## 2024-01-26 DIAGNOSIS — D696 Thrombocytopenia, unspecified: Secondary | ICD-10-CM | POA: Diagnosis not present

## 2024-01-26 DIAGNOSIS — Z79899 Other long term (current) drug therapy: Secondary | ICD-10-CM | POA: Diagnosis not present

## 2024-01-26 DIAGNOSIS — R718 Other abnormality of red blood cells: Secondary | ICD-10-CM | POA: Insufficient documentation

## 2024-01-26 LAB — VITAMIN B12: Vitamin B-12: 288 pg/mL (ref 180–914)

## 2024-01-26 LAB — CBC WITH DIFFERENTIAL (CANCER CENTER ONLY)
Abs Immature Granulocytes: 0.01 10*3/uL (ref 0.00–0.07)
Basophils Absolute: 0 10*3/uL (ref 0.0–0.1)
Basophils Relative: 1 %
Eosinophils Absolute: 0.1 10*3/uL (ref 0.0–0.5)
Eosinophils Relative: 1 %
HCT: 39.5 % (ref 36.0–46.0)
Hemoglobin: 12 g/dL (ref 12.0–15.0)
Immature Granulocytes: 0 %
Lymphocytes Relative: 38 %
Lymphs Abs: 2.3 10*3/uL (ref 0.7–4.0)
MCH: 21.7 pg — ABNORMAL LOW (ref 26.0–34.0)
MCHC: 30.4 g/dL (ref 30.0–36.0)
MCV: 71.6 fL — ABNORMAL LOW (ref 80.0–100.0)
Monocytes Absolute: 0.4 10*3/uL (ref 0.1–1.0)
Monocytes Relative: 7 %
Neutro Abs: 3.4 10*3/uL (ref 1.7–7.7)
Neutrophils Relative %: 53 %
Platelet Count: 117 10*3/uL — ABNORMAL LOW (ref 150–400)
RBC: 5.52 MIL/uL — ABNORMAL HIGH (ref 3.87–5.11)
RDW: 15.7 % — ABNORMAL HIGH (ref 11.5–15.5)
WBC Count: 6.2 10*3/uL (ref 4.0–10.5)
nRBC: 0 % (ref 0.0–0.2)

## 2024-01-29 ENCOUNTER — Ambulatory Visit: Payer: Self-pay | Admitting: Nurse Practitioner

## 2024-01-29 NOTE — Telephone Encounter (Addendum)
 Attempted to contact the patient via telephone call. Unable to reach the patient. Left detailed vmail with the provider's comments from below. Reached out to patient's daughter, Jocelyn White. Let her know provider's comments from below. Daughter voiced understanding and agreed to plan.   ----- Message from Lacie K Burton sent at 01/29/2024 11:53 AM EDT ----- Please let pt know platelets are 117, overall stable lately. Her B12 remains in low-normal range, I recommend starting oral B12 500 mcg or 1000 mg daily is fine. I will let her know the results on alpha thalassemia result. Please have her see PCP with CBC in 6 months, then lab/f/up with us  in 1 year.   Thanks Lacie

## 2024-02-08 LAB — ALPHA-THALASSEMIA GENOTYPR

## 2024-03-03 ENCOUNTER — Encounter: Payer: Self-pay | Admitting: Nurse Practitioner

## 2024-03-04 ENCOUNTER — Other Ambulatory Visit (HOSPITAL_COMMUNITY): Payer: Self-pay | Admitting: Family Medicine

## 2024-03-04 ENCOUNTER — Ambulatory Visit (HOSPITAL_COMMUNITY)
Admission: RE | Admit: 2024-03-04 | Discharge: 2024-03-04 | Disposition: A | Discharge status: Home / Self Care | Source: Ambulatory Visit | Attending: Vascular Surgery | Admitting: Vascular Surgery

## 2024-03-04 ENCOUNTER — Other Ambulatory Visit: Payer: Self-pay

## 2024-03-04 DIAGNOSIS — R609 Edema, unspecified: Secondary | ICD-10-CM

## 2024-03-04 DIAGNOSIS — M791 Myalgia, unspecified site: Secondary | ICD-10-CM | POA: Diagnosis not present

## 2024-03-04 DIAGNOSIS — M79606 Pain in leg, unspecified: Secondary | ICD-10-CM | POA: Diagnosis not present

## 2024-03-25 ENCOUNTER — Other Ambulatory Visit: Payer: Self-pay | Admitting: Internal Medicine

## 2024-03-25 ENCOUNTER — Ambulatory Visit
Admission: RE | Admit: 2024-03-25 | Discharge: 2024-03-25 | Disposition: A | Discharge status: Home / Self Care | Source: Ambulatory Visit | Attending: Internal Medicine | Admitting: Internal Medicine

## 2024-03-25 DIAGNOSIS — M25562 Pain in left knee: Secondary | ICD-10-CM

## 2024-05-13 ENCOUNTER — Other Ambulatory Visit (HOSPITAL_BASED_OUTPATIENT_CLINIC_OR_DEPARTMENT_OTHER): Payer: Self-pay | Admitting: Family Medicine

## 2024-05-13 DIAGNOSIS — E7800 Pure hypercholesterolemia, unspecified: Secondary | ICD-10-CM

## 2024-05-23 ENCOUNTER — Ambulatory Visit (HOSPITAL_COMMUNITY)
Admission: RE | Admit: 2024-05-23 | Discharge: 2024-05-23 | Disposition: A | Discharge status: Home / Self Care | Payer: Self-pay | Source: Ambulatory Visit | Attending: Cardiology | Admitting: Cardiology

## 2024-05-23 DIAGNOSIS — E7800 Pure hypercholesterolemia, unspecified: Secondary | ICD-10-CM | POA: Insufficient documentation

## 2024-05-23 DIAGNOSIS — I700 Atherosclerosis of aorta: Secondary | ICD-10-CM | POA: Insufficient documentation

## 2025-01-25 ENCOUNTER — Inpatient Hospital Stay

## 2025-01-25 ENCOUNTER — Inpatient Hospital Stay: Admitting: Hematology
